# Patient Record
Sex: Male | Born: 1990
Health system: Southern US, Community
[De-identification: ages and names within clinical notes are randomized; demographics above are authoritative.]

## PROBLEM LIST (undated history)

## (undated) DIAGNOSIS — J309 Allergic rhinitis, unspecified: Secondary | ICD-10-CM

## (undated) HISTORY — PX: NO PAST SURGERIES: SHX2092

## (undated) HISTORY — DX: Allergic rhinitis, unspecified: J30.9

---

## 2013-02-23 ENCOUNTER — Encounter (HOSPITAL_BASED_OUTPATIENT_CLINIC_OR_DEPARTMENT_OTHER): Payer: Self-pay

## 2013-02-23 ENCOUNTER — Emergency Department (HOSPITAL_BASED_OUTPATIENT_CLINIC_OR_DEPARTMENT_OTHER)
Admission: EM | Admit: 2013-02-23 | Discharge: 2013-02-23 | Disposition: A | Discharge status: Home / Self Care | Payer: BC Managed Care – PPO | Attending: Emergency Medicine | Admitting: Emergency Medicine

## 2013-02-23 DIAGNOSIS — J45909 Unspecified asthma, uncomplicated: Secondary | ICD-10-CM | POA: Insufficient documentation

## 2013-02-23 DIAGNOSIS — Z79899 Other long term (current) drug therapy: Secondary | ICD-10-CM | POA: Insufficient documentation

## 2013-02-23 DIAGNOSIS — L539 Erythematous condition, unspecified: Secondary | ICD-10-CM

## 2013-02-23 DIAGNOSIS — Y939 Activity, unspecified: Secondary | ICD-10-CM | POA: Insufficient documentation

## 2013-02-23 DIAGNOSIS — F17200 Nicotine dependence, unspecified, uncomplicated: Secondary | ICD-10-CM | POA: Insufficient documentation

## 2013-02-23 DIAGNOSIS — L089 Local infection of the skin and subcutaneous tissue, unspecified: Secondary | ICD-10-CM | POA: Insufficient documentation

## 2013-02-23 DIAGNOSIS — Y929 Unspecified place or not applicable: Secondary | ICD-10-CM | POA: Insufficient documentation

## 2013-02-23 DIAGNOSIS — R0602 Shortness of breath: Secondary | ICD-10-CM | POA: Insufficient documentation

## 2013-02-23 DIAGNOSIS — R21 Rash and other nonspecific skin eruption: Secondary | ICD-10-CM | POA: Insufficient documentation

## 2013-02-23 NOTE — ED Notes (Signed)
Pt reports he was stung by a bee or bitten by a spider on chest wall.

## 2013-02-23 NOTE — ED Provider Notes (Signed)
History     CSN: [id number]  Arrival date & time 02/23/13  1547   First MD Initiated Contact with Patient 02/23/13 1601      Chief Complaint  Patient presents with  . Shortness of Breath  . Insect Bite    (Consider location/radiation/quality/duration/timing/severity/associated sxs/prior treatment) Patient is a 22 y.o. male presenting with rash. The history is provided by the patient. No language interpreter was used.  Rash Location:  Torso Torso rash location:  L chest Quality: redness   Severity:  Mild Onset quality:  Sudden Duration:  1 hour Progression:  Unchanged Chronicity:  New Context: insect bite/sting   Relieved by:  None tried Associated symptoms: no throat swelling, no tongue swelling and not wheezing     Past Medical History  Diagnosis Date  . Asthma     History reviewed. No pertinent past surgical history.  No family history on file.  History  Substance Use Topics  . Smoking status: Current Every Day Smoker -- 0.50 packs/day    Types: Cigarettes  . Smokeless tobacco: Not on file  . Alcohol Use: No      Review of Systems  Respiratory: Negative for wheezing.   Skin: Positive for rash.  All other systems reviewed and are negative.    Allergies  Review of patient's allergies indicates no known allergies.  Home Medications   Current Outpatient Rx  Name  Route  Sig  Dispense  Refill  . HydrOXYzine Pamoate (VISTARIL PO)   Oral   Take by mouth.           BP 127/79  Pulse 83  Temp(Src) 98.9 F (37.2 C) (Oral)  Resp 18  Ht 5\' 7"  (1.702 m)  Wt 185 lb (83.915 kg)  BMI 28.97 kg/m2  SpO2 98%  Physical Exam  Nursing note and vitals reviewed. Constitutional: He is oriented to person, place, and time. He appears well-developed and well-nourished. No distress.  HENT:  Head: Normocephalic and atraumatic.  Mouth/Throat: Oropharynx is clear and moist.  Eyes: Conjunctivae are normal. Pupils are equal, round, and reactive to light.  Neck:  Normal range of motion. Neck supple.  Cardiovascular: Normal rate and regular rhythm.   Pulmonary/Chest: Effort normal and breath sounds normal. No respiratory distress. He has no wheezes.  Abdominal: Soft. Bowel sounds are normal.  Musculoskeletal: Normal range of motion. He exhibits no edema and no tenderness.  Lymphadenopathy:    He has no cervical adenopathy.  Neurological: He is alert and oriented to person, place, and time.  Skin: Skin is warm and dry. There is erythema.     Psychiatric: He has a normal mood and affect. His behavior is normal. Judgment and thought content normal.    ED Course  Procedures (including critical care time)  Labs Reviewed - No data to display No results found.   No diagnosis found.  Insect bite to chest wall.  Patient did not see what ?bit or stung him on his chest. Feeling of shortness of breath likely due to anxiety.  Patient currently in no distress, clear lung fields bilaterally, localized erythema to anterior chest wall about the size of a quarter, not pruritic or painful.  No urticaria or angioedema.  Symptomatic treatment and return precautions provided.  MDM          Jimmye Norman, NP 02/23/13 [phone]

## 2013-02-23 NOTE — ED Notes (Addendum)
Pt states he was either bitten by a spider or stung by a bee on his chest around 1500.  Initially he felt [facility], but states his breathing feels better. C/o pain, denies itching.  Pt also states he feels a little dizzy. No meds PTA.

## 2013-02-26 NOTE — ED Provider Notes (Addendum)
Patient from review of records from 2014 to current has no history of asthma but his past medical history as documented asthma he has not been seen or any evaluated from 2014 on for any wheezing or any asthma related problems it appears clinically the patient does not truly have a history of asthma.  Medical screening examination/treatment/procedure(s) were performed by non-physician practitioner and as supervising physician I was immediately available for consultation/collaboration.   Shelda Jakes, MD 02/26/13 1433  Vanetta Mulders, MD 11/10/17 1326

## 2014-05-03 ENCOUNTER — Ambulatory Visit: Payer: Self-pay

## 2014-05-28 ENCOUNTER — Ambulatory Visit: Payer: Self-pay | Admitting: Podiatry

## 2014-06-12 ENCOUNTER — Ambulatory Visit (INDEPENDENT_AMBULATORY_CARE_PROVIDER_SITE_OTHER): Payer: BC Managed Care – PPO

## 2014-06-12 ENCOUNTER — Encounter: Payer: Self-pay | Admitting: Podiatry

## 2014-06-12 ENCOUNTER — Ambulatory Visit (INDEPENDENT_AMBULATORY_CARE_PROVIDER_SITE_OTHER): Payer: BC Managed Care – PPO | Admitting: Podiatry

## 2014-06-12 VITALS — BP 124/82 | HR 84 | Resp 16 | Ht 66.0 in | Wt 150.0 lb

## 2014-06-12 DIAGNOSIS — Q6650 Congenital pes planus, unspecified foot: Secondary | ICD-10-CM

## 2014-06-12 DIAGNOSIS — L608 Other nail disorders: Secondary | ICD-10-CM

## 2014-06-12 NOTE — Progress Notes (Signed)
   Subjective:    Patient ID: Terry Pham, male    DOB: [birth year], 23 y.o.   MRN: [id number]  HPI Comments: 23 year old male presents the office today with complaints of bilateral foot pain. He states the pain has been present for "years" with the left more than the right. If the pain is worse with weightbearing and ambulation and after physical activity. He does not wear any inserts for his shoes our he does wear a small lift in his shoes to add height. He also has secondary complaints of discoloration and difficulty trimming nails to his right foot. No other complaints at this time to  Foot Pain      Review of Systems  All other systems reviewed and are negative.      Objective:   Physical Exam AAO x3, NAD DP/PT pulses palpable b/l. CRT < 3sec.  Protective sensation intact with the Semmes Weinstein monofilament, vibratory sensation intact. Significant decrease the medial arch height bilaterally upon weightbearing with a left more than the right. Tenderness to palpation over the navicular tuberosity. Able to perform double and single heel rise. Mild equinus. MMT 5/5.  Nails thick, brittle, yellow-brown discoloration right foot worse than left.     Assessment & Plan:  23 year old male pes planus, nail dystrophy -X-rays were obtained. The x-ray report for full details -Discussed both conservative as well as surgical intervention with the patient in detail for pes planus including alternatives, risks, complications. At this time we'll proceed with conservative treatment  [facility] with orthotic therapy. Patient is unable it casted today due to time however he will call to get scheduled for scanning. -Nails on the right foot sharply debrided without difficulty to be sent for biopsy to evaluate for onychomycosis. -Followup for her orthotics scanning and will call patient with results of the nail biopsy.

## 2014-06-14 ENCOUNTER — Other Ambulatory Visit: Payer: BC Managed Care – PPO

## 2014-06-18 ENCOUNTER — Ambulatory Visit: Payer: BC Managed Care – PPO

## 2014-06-18 DIAGNOSIS — Q6650 Congenital pes planus, unspecified foot: Secondary | ICD-10-CM

## 2014-06-18 NOTE — Progress Notes (Signed)
Pt is here for orthotic scan

## 2014-06-24 ENCOUNTER — Encounter: Payer: Self-pay | Admitting: Podiatry

## 2014-07-09 ENCOUNTER — Other Ambulatory Visit: Payer: BC Managed Care – PPO

## 2014-07-12 ENCOUNTER — Ambulatory Visit: Payer: BC Managed Care – PPO | Admitting: Podiatry

## 2014-09-06 ENCOUNTER — Other Ambulatory Visit: Payer: BC Managed Care – PPO

## 2014-09-20 ENCOUNTER — Encounter (HOSPITAL_BASED_OUTPATIENT_CLINIC_OR_DEPARTMENT_OTHER): Payer: Self-pay

## 2014-09-20 ENCOUNTER — Emergency Department (HOSPITAL_BASED_OUTPATIENT_CLINIC_OR_DEPARTMENT_OTHER)
Admission: EM | Admit: 2014-09-20 | Discharge: 2014-09-20 | Disposition: A | Discharge status: Home / Self Care | Payer: BC Managed Care – PPO | Attending: Emergency Medicine | Admitting: Emergency Medicine

## 2014-09-20 DIAGNOSIS — Y9389 Activity, other specified: Secondary | ICD-10-CM | POA: Insufficient documentation

## 2014-09-20 DIAGNOSIS — S60561A Insect bite (nonvenomous) of right hand, initial encounter: Secondary | ICD-10-CM | POA: Insufficient documentation

## 2014-09-20 DIAGNOSIS — Y998 Other external cause status: Secondary | ICD-10-CM | POA: Diagnosis not present

## 2014-09-20 DIAGNOSIS — Z79899 Other long term (current) drug therapy: Secondary | ICD-10-CM | POA: Diagnosis not present

## 2014-09-20 DIAGNOSIS — W57XXXA Bitten or stung by nonvenomous insect and other nonvenomous arthropods, initial encounter: Secondary | ICD-10-CM | POA: Diagnosis not present

## 2014-09-20 DIAGNOSIS — Y9289 Other specified places as the place of occurrence of the external cause: Secondary | ICD-10-CM | POA: Diagnosis not present

## 2014-09-20 DIAGNOSIS — T63481A Toxic effect of venom of other arthropod, accidental (unintentional), initial encounter: Secondary | ICD-10-CM

## 2014-09-20 DIAGNOSIS — J45909 Unspecified asthma, uncomplicated: Secondary | ICD-10-CM | POA: Diagnosis not present

## 2014-09-20 DIAGNOSIS — Z720 Tobacco use: Secondary | ICD-10-CM | POA: Insufficient documentation

## 2014-09-20 MED ORDER — EPINEPHRINE 0.3 MG/0.3ML IJ SOAJ: 0.3000 mg | Freq: Once | INTRAMUSCULAR | Status: DC | PRN

## 2014-09-20 MED ORDER — DIPHENHYDRAMINE HCL 12.5 MG/5ML PO ELIX
25.0000 mg | ORAL_SOLUTION | Freq: Once | ORAL | Status: AC
  Administered 2014-09-20: 25 mg via ORAL
  Filled 2014-09-20: qty 10

## 2014-09-20 MED ORDER — FAMOTIDINE 20 MG PO TABS
20.0000 mg | ORAL_TABLET | Freq: Once | ORAL | Status: AC
  Administered 2014-09-20: 20 mg via ORAL
  Filled 2014-09-20: qty 1

## 2014-09-20 MED ORDER — PREDNISOLONE SODIUM PHOSPHATE 15 MG/5ML PO SOLN
40.0000 mg | Freq: Once | ORAL | Status: AC
  Administered 2014-09-20: 40 mg via ORAL
  Filled 2014-09-20: qty 15

## 2014-09-20 MED ORDER — FAMOTIDINE 20 MG PO TABS
20.0000 mg | ORAL_TABLET | Freq: Every day | ORAL | Status: DC
Start: 2014-09-20 — End: 2018-01-31

## 2014-09-20 MED ORDER — PREDNISOLONE SODIUM PHOSPHATE 15 MG/5ML PO SOLN: 40.0000 mg | Freq: Every day | ORAL | Status: AC

## 2014-09-20 MED ORDER — PREDNISOLONE 15 MG/5ML PO SOLN
ORAL | Status: AC
  Administered 2014-09-20: 40 mg via ORAL
  Filled 2014-09-20: qty 3

## 2014-09-20 NOTE — ED Provider Notes (Addendum)
CSN: [id number]     Arrival date & time 09/20/14  1801 History   First MD Initiated Contact with Patient 09/20/14 2214     Chief Complaint  Patient presents with  . Insect Bite   (Consider location/radiation/quality/duration/timing/severity/associated sxs/prior Treatment) HPI  Terry Pham is a 23 yo male presenting with local allergic reaction to right hand after flying insect sting yesterday.  He was outside and felt something flying behind his neck, he swatted at it and felt a sharp sting to his right hand.  His hand began to swell and become reddened.  He denies any shortness of breath, nausea,vomiting, abd pain, voice change or oral airway swelling.  Past Medical History  Diagnosis Date      Patient had history of asthma listed- denies history of asthma This is a late entry and is edited per patient request of amendment History reviewed. No pertinent past surgical history. No family history on file. History  Substance Use Topics  . Smoking status: Current Every Day Smoker  . Smokeless tobacco: Not on file  . Alcohol Use: No    Review of Systems  Constitutional: Negative for fever and chills.  HENT: Negative for sore throat and voice change.   Respiratory: Negative for chest tightness, shortness of breath and wheezing.   Cardiovascular: Negative for chest pain.  Gastrointestinal: Negative for nausea, vomiting, abdominal pain and diarrhea.  Musculoskeletal: Negative for myalgias.  Skin: Positive for color change and rash.  Neurological: Negative for syncope, weakness, numbness and headaches.      Allergies  Review of patient's allergies indicates no known allergies.  Home Medications   Prior to Admission medications   Medication Sig Start Date End Date Taking? Authorizing Provider  HYDROXYZINE HCL PO Take by mouth.   Yes Historical Provider, MD   BP 135/90 mmHg  Pulse 109  Temp(Src) 98.3 F (36.8 C) (Oral)  Resp 20  Ht 5\' 3"  (1.6 m)  Wt 180 lb (81.647 kg)   BMI 31.89 kg/m2  SpO2 100% Physical Exam  Constitutional: He appears well-developed and well-nourished. No distress.  HENT:  Head: Normocephalic and atraumatic.  Mouth/Throat: Oropharynx is clear and moist. No oropharyngeal exudate.  Eyes: Conjunctivae are normal.  Neck: Normal range of motion. Neck supple.  Cardiovascular: Normal rate, regular rhythm and intact distal pulses.   Pulmonary/Chest: Effort normal and breath sounds normal. No respiratory distress. He has no wheezes. He has no rales. He exhibits no tenderness.  Abdominal: Soft. There is no tenderness.  Musculoskeletal: He exhibits no tenderness.  Neurological: He is alert.  Skin: Skin is warm and dry. Rash noted. Rash is urticarial. He is not diaphoretic. There is erythema.     Good pulse, motor and sensation to right hand.  Psychiatric: He has a normal mood and affect.  Nursing note and vitals reviewed.   ED Course  Procedures (including critical care time) Labs Review Labs Reviewed - No data to display  Imaging Review No results found.   EKG Interpretation None      MDM   Final diagnoses:  Allergic reaction to insect sting, accidental or unintentional, initial encounter   23 yo male with local allergic reaction, no signs of anaphylaxis. He has trouble swallowing pills prior to the allergic reaction, so liquid versions of benadryl, prednisone and pepcid were given in the ED. He was re-evaluated prior to dc, he remains hemodynamically stable, in no respiratory distress, and denies the feeling of throat closing. Pt has been advised to take OTC  benadryl & return to the ED if they have a mod-severe allergic rxn (s/s including throat closing, difficulty breathing, swelling of lips face or tongue). Pt is to follow up with their PCP. Pt is agreeable with plan & verbalizes understanding.   Filed Vitals:   09/20/14 1809 09/20/14 2242  BP: 135/90 110/67  Pulse: 109 75  Temp: 98.3 F (36.8 C)   TempSrc: Oral   Resp:  20 16  Height: 5\' 3"  (1.6 m)   Weight: 180 lb (81.647 kg)   SpO2: 100% 99%   Meds given in ED:  Medications  diphenhydrAMINE (BENADRYL) 12.5 MG/5ML elixir 25 mg (25 mg Oral Given 09/20/14 2243)  prednisoLONE (ORAPRED) 15 MG/5ML solution 40 mg (40 mg Oral Given 09/20/14 2245)  famotidine (PEPCID) tablet 20 mg (20 mg Oral Given 09/20/14 2245)    Discharge Medication List as of 09/20/2014 10:49 PM    START taking these medications   Details  EPINEPHrine (EPIPEN 2-PAK) 0.3 mg/0.3 mL IJ SOAJ injection Inject 0.3 mLs (0.3 mg total) into the muscle once as needed (for severe allergic reaction). CAll 911 immediately if you have to use this medicine, Starting 09/20/2014, Until Discontinued, Print    famotidine (PEPCID) 20 MG tablet Take 1 tablet (20 mg total) by mouth daily., Starting 09/20/2014, Until Discontinued, Print    prednisoLONE (ORAPRED) 15 MG/5ML solution Take 13.3 mLs (40 mg total) by mouth daily before breakfast., Starting 09/20/2014, Until Wed 09/25/14, Print           Harle BattiestElizabeth Tysinger, NP 09/22/14 1546  Hilario Quarryanielle S Mirage Pfefferkorn, MD 09/28/14 [id number]  Margarita Grizzleay, Enzio Buchler, MD 12/12/17 [id number]  Margarita Grizzleay, Deontaye Civello, MD 12/12/17 [phone]

## 2014-09-20 NOTE — ED Notes (Signed)
Assessed from triage room-NAD

## 2014-09-20 NOTE — ED Notes (Signed)
Bee sting to middle finger rt hand yesterday,  Swelling to rt hand  Ice applied

## 2014-09-20 NOTE — ED Notes (Addendum)
?  bee sting to palm of right hand yesterday-swelling/redness to back of hand today-no resp distress-states he has been stung by bees before without systemic reaction

## 2014-09-20 NOTE — Discharge Instructions (Signed)
Please follow the directions provided.  Be sure to follow-up with your primary care provider to ensure you are getting better.  You may take over the counter benadryl 25 mg every 6 hours.  The orapred and pepcid take as directed.  The epipen is only to use for emergency allergic reactions including sudden shortness of breath, facial or airway swelling, dizziness/lightheadedness, nausea, vomiting or abd pain.  If you use your epipen you must still come to the emergency department for further treatment.  Don't hesitate to return for any new, worsening or concerning symptoms.    SEEK IMMEDIATE MEDICAL CARE IF:  You have symptoms of an allergic reaction which are:  Wheezing.  Difficulty breathing.  Chest pain.  Lightheadedness or fainting.  Itchy, raised, red patches on the skin.  Nausea, vomiting, cramping or diarrhea.   Emergency Department Resource Guide 1) Find a Doctor and Pay Out of Pocket Although you won't have to find out who is covered by your insurance plan, it is a good idea to ask around and get recommendations. You will then need to call the office and see if the doctor you have chosen will accept you as a new patient and what types of options they offer for patients who are self-pay. Some doctors offer discounts or will set up payment plans for their patients who do not have insurance, but you will need to ask so you aren't surprised when you get to your appointment.  2) Contact Your Local Health Department Not all health departments have doctors that can see patients for sick visits, but many do, so it is worth a call to see if yours does. If you don't know where your local health department is, you can check in your phone book. The CDC also has a tool to help you locate your state's health department, and many state websites also have listings of all of their local health departments.  3) Find a Walk-in Clinic If your illness is not likely to be very severe or complicated, you may  want to try a walk in clinic. These are popping up all over the country in pharmacies, drugstores, and shopping centers. They're usually staffed by nurse practitioners or physician assistants that have been trained to treat common illnesses and complaints. They're usually fairly quick and inexpensive. However, if you have serious medical issues or chronic medical problems, these are probably not your best option.  No Primary Care Doctor: - Call Health Connect at  [phone] - they can help you locate a primary care doctor that  accepts your insurance, provides certain services, etc. - Physician Referral Service- [phone]  Chronic Pain Problems: Organization         Address  Phone   Notes  [facility]  [phone] Patients need to be referred by their primary care doctor.   Medication Assistance: Organization         Address  Phone   Notes  [facility]ication [facility] [address] [place], [place] [address] [phone] --Must be a resident of [place] -- Must have NO insurance coverage whatsoever (no Medicaid/ Medicare, etc.) -- The pt. MUST have a primary care doctor that directs their care regularly and follows them in the community   MedAssist  [phone]) [phone]   [employer]  [phone]    Agencies that provide inexpensive medical care: Organization         Address  Phone   Notes  [facility]  [phone]   [facility]    [phone]   [address] [facility] [place], [place] [address] [phone]   [facility] [address], [place] [phone]   Planned Parenthood    [phone]   [facility]    [phone]   [place] and [place] [address], [place] Phone:  [phone], Fax:  [phone] Hours of Operation:  9 am - 6 pm, M-F.  Also accepts Medicaid/Medicare and self-pay.   [facility] [place] for [place] [address], [address], [place] Phone: [phone], Fax: [phone]. Hours of Operation:  8:30 am - 5:30 pm, M-F.  Also accepts Medicaid and self-pay.  [facility] [place] [address] [place] Phone: [phone]   [place], [place], [place] [phone], Ext. [phone] Mondays & Thursdays: 7-9 AM.  First 15 patients are seen on a first come, first serve basis.    [place] Providers:  Organization         Address  Phone   Notes  [facility] [address], [place] [phone] Also accepts self-pay patients.  [facility] [address], [place]  [phone]   [place], [address], [place] [phone]   [facility] [address] [place] [phone]   Lucianne Lei [address], [place]   [phone] Only accepts [place] Access [place] patients after they have their name applied to their card.   Self-Pay (no insurance) in [facility]:  Organization         Address  Phone   Notes  Sickle Cell Patients, [facility] [place] [phone]   [facility] [place] [phone]   [facility] [place]  [place], [place], [place] [phone]   [facility]/Dr. Osei-Bonsu  [address] [place] or [address], [place] [phone] Phone number for both [place] and [place] locations is the same.  [facility]gent [facility] and [facility] [address], [place] [phone]   [facility] [address] [place] or [address] [phone]   [facility] [address], [place] [phone], phone; [phone], fax Sees patients 1st and 3rd Saturday of every month.  Must not qualify for public or private insurance (i.e. Medicaid, Medicare, [place] Health Choice, Veterans' Benefits)  Household income should be no more than 200% of the poverty level The clinic cannot treat you if you are pregnant or think you are pregnant  Sexually transmitted diseases are not treated at the clinic.    Dental Care: Organization         Address  Phone  Notes  [facility] Department of [facility] [phone] Accepts children up to age 80 who are enrolled in [place] or [place]; pregnant women with a Medicaid card; and children who have applied for Medicaid or  Health Choice, but were declined, whose parents can pay a reduced fee at time of service.  [facility] Department of  [facility]  [address], [place] [phone] Accepts children up to age 81 who are enrolled in Medicaid or [place]; pregnant women with a Medicaid card; and children who have applied for Medicaid or [place] Health Choice, but were declined, whose parents can pay a reduced fee at time of service.  [facility] Access PRO[facility]  [place] [phone] Patients are seen by appointment only. Walk-ins are not accepted. [place] will see patients 26 years of age and older. Monday - Tuesday (8am-5pm) Most Wednesdays (8:30-5pm) $30 per visit, cash only  [facility] Adult Dental Access PROGRAM  [address], [facility] [phone] Patients are seen by appointment only. Walk-ins are not accepted. [place] will see patients 48 years of age and older. One Wednesday Evening (Monthly: Volunteer Based).  $30 per visit, cash only  [place]  [phone] for adults; Children under age 78, call Graduate Pediatric Dentistry at [phone]. Children aged 19-14, please call [phone] to request a pediatric application.  Dental services are provided in all areas of dental care including fillings, crowns and bridges, complete and partial dentures, implants, gum treatment, root canals, and extractions. Preventive care is also provided. Treatment is provided to both adults and children. Patients are selected via a lottery and there is often a waiting list.   [facility] [address], [place]  [phone] [email or web address]   [facility] [address] [place], [place] [phone], Ext. [phone] Second and Fourth Thursday of each month, opens at 6:30 AM; Clinic ends at 9 AM.  Patients are seen on a first-come first-served basis, and a limited number are seen during each clinic.   [facility]  [address] Hillard Danker [place], [place] [phone]   Eligibility Requirements You must have lived in [place], [place], or [place] counties for at least the last three months.   You cannot be eligible for state or federal sponsored [employer], including [employer], [place], or [employer].   You generally cannot be eligible for healthcare insurance through your employer.    How to apply: Eligibility screenings are held every Tuesday and Wednesday afternoon from 1:00 pm until 4:00 pm. You do not need an appointment for the interview!  [facility] [address] [place], [place]   [place]  [place] Department  [place]  [phone]    Behavioral Health Resources in the Community: Intensive Outpatient Programs Organization         Address  Phone  Notes  [place] [facility]. [address] [place], [place] [phone]   [facility] [address], [place], [place]   ADS: [facility] [address] [place], [place]    [place] [address]  [place], [place] or [phone]   Substance Abuse Resources Organization         Address  Phone  Notes  [facility] Drug Services  [phone]   [place]  [phone]   [facility] [place]   Chinita Pester  [phone]   Residential & Outpatient Substance Abuse Program  [phone]   Psychological Services Organization         Address  Phone  Notes  [place]  [phone]   [facility]  336- [phone]   [facility] [address], [place] [phone] or [phone]    Mobile Crisis Teams Organization         Address  Phone  Notes  [facility], Mobile Crisis Care Unit  [phone]   [facility]  [address] [place], [place] [phone]   Doristine LocksSharon DeEsch [address] [place] [phone]    Self-Help/Support Groups Organization         Address  Phone             Notes  [facility]ental Health Assoc[facility] of [place] - variety of support groups  336- [phone] Call for more information  Narcotics Anonymous (NA), Caring [facility] [address], [place]  2 meetings at this location   Statisticianesidential Treatment Programs Organization         Address  Phone  Notes  [facility] [address],    [place]  [phone]   [facility]  [address], [place] [phone], [place], [place] [phone]   [facility] [address], [place] [phone] Admissions: 8am-3pm M-F  [facility] [address]    [place], [place] [phone]   [facility] [address], [place], [place] [phone]   [facility] [address]  [place], [place] [phone]   [facility] - Intensive Outpatient [address], [place], [place] [phone]   [facility] ([facility] [address]  [place], [place] [phone] or [phone]   [facility] (RTS) [address] [place], [place] [phone] Accepts Medicaid  [facility] [address]  [place] [phone] Substance Abuse/Addiction Treatment   [facility] Behavioral Health Resources Organization         Address  Phone  Notes  [facility]  [phone](888) [phone]   Angie FavaJulie Brannon, PhD [address], [address] [place], [place]   [phone]) [phone] or [phone]) [phone]   [facility]    [address] [place], [place] [phone]) [phone]   [facility] [address], [place], [place] [phone]) [phone] Insurance/Medicaid/sponsorship through [facility]  [facility] [address]                                    [place], [place] [phone]) [phone] Therapy/tele-psych/case  [facility] [address]   [place], [place] [phone]) [phone]    Dr. Lolly MustacheArfeen  [phone]) [phone]   [facility] [place] [place]  [employer] [facility] 1) [address], [place] 2) [address], [place] 3)  [address], [place] [phone](336) [phone]  [phone]) [phone]   [facility] Child Abuse Hotline [phone](336) 4077828313 or [phone](336) [phone] (After Hours)

## 2014-10-04 ENCOUNTER — Other Ambulatory Visit: Payer: BC Managed Care – PPO

## 2014-10-11 ENCOUNTER — Other Ambulatory Visit: Payer: BC Managed Care – PPO

## 2014-12-31 ENCOUNTER — Other Ambulatory Visit: Payer: BLUE CROSS/BLUE SHIELD

## 2015-03-31 ENCOUNTER — Ambulatory Visit: Payer: BLUE CROSS/BLUE SHIELD | Admitting: *Deleted

## 2015-03-31 DIAGNOSIS — Q6650 Congenital pes planus, unspecified foot: Secondary | ICD-10-CM

## 2015-03-31 NOTE — Progress Notes (Signed)
Patient ID: Terry Pham, male   DOB: 11-Jan-1991, 24 y.o.   MRN: [id number] PICKING UP INSERTS

## 2015-03-31 NOTE — Patient Instructions (Signed)

## 2015-08-08 ENCOUNTER — Encounter (HOSPITAL_COMMUNITY): Payer: Self-pay | Admitting: *Deleted

## 2015-08-08 ENCOUNTER — Emergency Department (HOSPITAL_COMMUNITY)
Admission: EM | Admit: 2015-08-08 | Discharge: 2015-08-08 | Disposition: A | Discharge status: Home / Self Care | Payer: BLUE CROSS/BLUE SHIELD | Attending: Emergency Medicine | Admitting: Emergency Medicine

## 2015-08-08 DIAGNOSIS — J029 Acute pharyngitis, unspecified: Secondary | ICD-10-CM | POA: Diagnosis not present

## 2015-08-08 DIAGNOSIS — Z79899 Other long term (current) drug therapy: Secondary | ICD-10-CM | POA: Diagnosis not present

## 2015-08-08 DIAGNOSIS — J45909 Unspecified asthma, uncomplicated: Secondary | ICD-10-CM | POA: Diagnosis not present

## 2015-08-08 DIAGNOSIS — Z720 Tobacco use: Secondary | ICD-10-CM | POA: Diagnosis not present

## 2015-08-08 DIAGNOSIS — R070 Pain in throat: Secondary | ICD-10-CM | POA: Diagnosis present

## 2015-08-08 NOTE — ED Notes (Signed)
Per EMS, pt complains of a sore lump in his throat, around his epiglottis. Pt states he noticed the lump on Monday, after which he took benadryl, which he states helped. Pt states he noticed the lump again this morning, which he states "has been freaking me out". Pt took 3 teaspoons of children's benadryl before arrival to hospital. Pt denies cough/congestion.sneezing.

## 2015-08-08 NOTE — Discharge Instructions (Signed)
- Use OTC pain relievers (advil, tylenol) and chloraseptic spray for symptom relief - Return to ED with difficulty breathing or inability to drink or swallow   Pharyngitis Pharyngitis is redness, pain, and swelling (inflammation) of your pharynx.  CAUSES  Pharyngitis is usually caused by infection. Most of the time, these infections are from viruses (viral) and are part of a cold. However, sometimes pharyngitis is caused by bacteria (bacterial). Pharyngitis can also be caused by allergies. Viral pharyngitis may be spread from person to person by coughing, sneezing, and personal items or utensils (cups, forks, spoons, toothbrushes). Bacterial pharyngitis may be spread from person to person by more intimate contact, such as kissing.  SIGNS AND SYMPTOMS  Symptoms of pharyngitis include:   Sore throat.   Tiredness (fatigue).   Low-grade fever.   Headache.  Joint pain and muscle aches.  Skin rashes.  Swollen lymph nodes.  Plaque-like film on throat or tonsils (often seen with bacterial pharyngitis). DIAGNOSIS  Your health care provider will ask you questions about your illness and your symptoms. Your medical history, along with a physical exam, is often all that is needed to diagnose pharyngitis. Sometimes, a rapid strep test is done. Other lab tests may also be done, depending on the suspected cause.  TREATMENT  Viral pharyngitis will usually get better in 3-4 days without the use of medicine. Bacterial pharyngitis is treated with medicines that kill germs (antibiotics).  HOME CARE INSTRUCTIONS   Drink enough water and fluids to keep your urine clear or pale yellow.   Only take over-the-counter or prescription medicines as directed by your health care provider:   If you are prescribed antibiotics, make sure you finish them even if you start to feel better.   Do not take aspirin.   Get lots of rest.   Gargle with 8 oz of salt water ( tsp of salt per 1 qt of water) as  often as every 1-2 hours to soothe your throat.   Throat lozenges (if you are not at risk for choking) or sprays may be used to soothe your throat. SEEK MEDICAL CARE IF:   You have large, tender lumps in your neck.  You have a rash.  You cough up green, yellow-brown, or bloody spit. SEEK IMMEDIATE MEDICAL CARE IF:   Your neck becomes stiff.  You drool or are unable to swallow liquids.  You vomit or are unable to keep medicines or liquids down.  You have severe pain that does not go away with the use of recommended medicines.  You have trouble breathing (not caused by a stuffy nose). MAKE SURE YOU:   Understand these instructions.  Will watch your condition.  Will get help right away if you are not doing well or get worse.   This information is not intended to replace advice given to you by your health care provider. Make sure you discuss any questions you have with your health care provider.   Document Released: 10/11/2005 Document Revised: 08/01/2013 Document Reviewed: 06/18/2013 Elsevier Interactive Patient Education 2016 Elsevier Inc.  Sore Throat A sore throat is pain, burning, irritation, or scratchiness of the throat. There is often pain or tenderness when swallowing or talking. A sore throat may be accompanied by other symptoms, such as coughing, sneezing, fever, and swollen neck glands. A sore throat is often the first sign of another sickness, such as a cold, flu, strep throat, or mononucleosis (commonly known as mono). Most sore throats go away without medical treatment. CAUSES  The most common causes of a sore throat include:  A viral infection, such as a cold, flu, or mono.  A bacterial infection, such as strep throat, tonsillitis, or whooping cough.  Seasonal allergies.  Dryness in the air.  Irritants, such as smoke or pollution.  Gastroesophageal reflux disease (GERD). HOME CARE INSTRUCTIONS   Only take over-the-counter medicines as directed by your  caregiver.  Drink enough fluids to keep your urine clear or pale yellow.  Rest as needed.  Try using throat sprays, lozenges, or sucking on hard candy to ease any pain (if older than 4 years or as directed).  Sip warm liquids, such as broth, herbal tea, or warm water with honey to relieve pain temporarily. You may also eat or drink cold or frozen liquids such as frozen ice pops.  Gargle with salt water (mix 1 tsp salt with 8 oz of water).  Do not smoke and avoid secondhand smoke.  Put a cool-mist humidifier in your bedroom at night to moisten the air. You can also turn on a hot shower and sit in the bathroom with the door closed for 5-10 minutes. SEEK IMMEDIATE MEDICAL CARE IF:  You have difficulty breathing.  You are unable to swallow fluids, soft foods, or your saliva.  You have increased swelling in the throat.  Your sore throat does not get better in 7 days.  You have nausea and vomiting.  You have a fever or persistent symptoms for more than 2-3 days.  You have a fever and your symptoms suddenly get worse. MAKE SURE YOU:   Understand these instructions.  Will watch your condition.  Will get help right away if you are not doing well or get worse.   This information is not intended to replace advice given to you by your health care provider. Make sure you discuss any questions you have with your health care provider.   Document Released: 11/18/2004 Document Revised: 11/01/2014 Document Reviewed: 06/18/2012 Elsevier Interactive Patient Education [employer]

## 2015-08-08 NOTE — ED Notes (Signed)
Bed: WTR7 Expected date:  Expected time:  Means of arrival:  Comments: Ems-Lump in throat

## 2015-08-08 NOTE — ED Provider Notes (Addendum)
CSN: [id number]     Arrival date & time 08/08/15  1216 History  By signing my name below, I, Terry Pham, attest that this documentation has been prepared under the direction and in the presence of Stevi Barrett, PA-C. Electronically Signed: Tanda RockersMargaux Pham, ED Scribe. 08/08/2015. 12:52 PM.  Chief Complaint  Patient presents with  .    The history is provided by the patient. No language interpreter was used.     HPI Comments: Terry Pham is a 24 y.o. male brought in by ambulance, who presents to the Emergency Department complaining of gradual onset, intermittent, lump in his throat x 4-5 days ago. Pt states he can see that his uvula is bigger than normal and believes that this is causing the sensation of a lump in his throat. He took Benadryl when the symptoms first began with relief. He reports that the lump was present for an hour but went away after taking Benadryl. The sensation returned this morning and has been persistent since. Pt states it is hard to swallow due to it being painful. He has been able to eat and drink normally. Denies choking, shortness of breath, fever, chills, hot sweats, congestion, rhinorrhea, ear pain, eye itching, eye pain, nausea, vomiting, chest pain, diarrhea, or any other associated symptoms. No recent sick contact with similar symptoms. Pt is not currently in school.   Past Medical History:  Diagnosis Date   Prior chart reports asthma, however patient denies this ever being a diagnosis and believes it was entered in error.    History reviewed. No pertinent surgical history. No family history on file. Social History   Tobacco Use  . Smoking status: Current Every Day Smoker  . Smokeless tobacco: Never Used  Substance Use Topics  . Alcohol use: No  . Drug use: No    Review of Systems  Constitutional: Negative for fever, chills and diaphoresis.  HENT: Positive for sore throat and trouble swallowing. Negative for congestion and rhinorrhea.        +  Lump in throat  Eyes: Negative for pain and itching.  Respiratory: Negative for shortness of breath.   Cardiovascular: Negative for chest pain.  Gastrointestinal: Negative for nausea, vomiting and diarrhea.   Allergies  Patient has no known allergies.  Home Medications   Prior to Admission medications   Medication Sig Start Date End Date Taking? Authorizing Provider  EPINEPHrine (EPIPEN 2-PAK) 0.3 mg/0.3 mL IJ SOAJ injection Inject 0.3 mLs (0.3 mg total) into the muscle once as needed (for severe allergic reaction). CAll 911 immediately if you have to use this medicine 09/20/14   Harle Battiestysinger, Elizabeth, NP  famotidine (PEPCID) 20 MG tablet Take 1 tablet (20 mg total) by mouth daily. Patient not taking: Reported on 06/22/2016 09/20/14   Harle Battiestysinger, Elizabeth, NP  hydrOXYzine (ATARAX) 10 MG/5ML syrup Take 10 mg by mouth 2 (two) times daily as needed (allergies).    [provider]  HydrOXYzine Pamoate (VISTARIL PO) Take by mouth.    [provider]   Triage VItals: BP 109/75 mmHg  Pulse 71  Temp(Src) 98 F (36.7 C) (Oral)  Resp 18  SpO2 98%   Physical Exam  Constitutional: He appears well-developed and well-nourished. No distress.  HENT:  Head: Normocephalic and atraumatic.  Right Ear: External ear normal.  Left Ear: External ear normal.  Mouth/Throat: Uvula is midline and mucous membranes are normal. No trismus in the jaw. No uvula swelling. Posterior oropharyngeal erythema present. No oropharyngeal exudate or posterior oropharyngeal edema.  Some oropharyngeal erythema. No tonsillar or uvula edema. No oropharyngeal exudates. Pt handling secretions well  Eyes: Conjunctivae are normal. Right eye exhibits no discharge. Left eye exhibits no discharge. No scleral icterus.  Neck: Normal range of motion. Neck supple.  No anterior or posterior cervical adenopathy. No swelling of neck. Painless ROM.   Cardiovascular: Normal rate, regular rhythm and normal heart sounds.    Pulmonary/Chest: Effort normal and breath sounds normal. No stridor. No respiratory distress. He has no wheezes. He has no rales.  Pt breathing unlabored.   Musculoskeletal: Normal range of motion.  Moves all extremities spontaneously  Lymphadenopathy:    He has no cervical adenopathy.  Neurological: He is alert. Coordination normal.  Skin: Skin is warm and dry.  Psychiatric: He has a normal mood and affect. His behavior is normal.    ED Course  Procedures (including critical care time)  DIAGNOSTIC STUDIES: Oxygen Saturation is 98% on RA, normal by my interpretation.    COORDINATION OF CARE: 12:50 PM-Discussed treatment plan which includes OTC pain medication and chloraseptic spray with pt at bedside and pt agreed to plan.   Labs Review Labs Reviewed - No data to display  Imaging Review No results found.   EKG Interpretation None       MDM   Final diagnoses:  Viral pharyngitis   Pt afebrile without tonsillar exudate or cervical adenopathy. Centor score 1 - no further testing or antibiotics indicated. Presents with mild dysphagia; diagnosis of viral pharyngitis. DC w symptomatic tx for pain to include OTC pain relievers and chloraseptic spray. Pt may continue benadryl if it provides him relief.  Pt does not appear dehydrated, but did discuss importance of water rehydration. Presentation non concerning for PTA or infxn spread to soft tissue. No trismus or uvula deviation. Specific return precautions discussed. Pt able to drink water in ED without difficulty with intact air way. Recommended PCP follow up.  I personally performed the services described in this documentation, which was scribed in my presence. The recorded information has been reviewed and is accurate.      [name], PA-C 08/08/15 1311  [name], MD 08/09/15 1610  [name], MD 10/06/17 0004

## 2016-06-22 ENCOUNTER — Encounter (HOSPITAL_COMMUNITY): Payer: Self-pay | Admitting: Emergency Medicine

## 2016-06-22 ENCOUNTER — Emergency Department (HOSPITAL_COMMUNITY)
Admission: EM | Admit: 2016-06-22 | Discharge: 2016-06-22 | Disposition: A | Discharge status: Home / Self Care | Payer: BLUE CROSS/BLUE SHIELD | Attending: Emergency Medicine | Admitting: Emergency Medicine

## 2016-06-22 DIAGNOSIS — J45909 Unspecified asthma, uncomplicated: Secondary | ICD-10-CM | POA: Insufficient documentation

## 2016-06-22 DIAGNOSIS — K122 Cellulitis and abscess of mouth: Secondary | ICD-10-CM | POA: Diagnosis not present

## 2016-06-22 DIAGNOSIS — F17200 Nicotine dependence, unspecified, uncomplicated: Secondary | ICD-10-CM | POA: Insufficient documentation

## 2016-06-22 DIAGNOSIS — Z79899 Other long term (current) drug therapy: Secondary | ICD-10-CM | POA: Insufficient documentation

## 2016-06-22 DIAGNOSIS — J029 Acute pharyngitis, unspecified: Secondary | ICD-10-CM | POA: Diagnosis present

## 2016-06-22 LAB — RAPID STREP SCREEN (MED CTR MEBANE ONLY): Streptococcus, Group A Screen (Direct): NEGATIVE

## 2016-06-22 MED ORDER — PREDNISOLONE 15 MG/5ML PO SOLN: 10.0000 mg | Freq: Every day | ORAL | 0 refills | Status: AC

## 2016-06-22 MED ORDER — PREDNISOLONE SODIUM PHOSPHATE 15 MG/5ML PO SOLN
10.0000 mg | Freq: Once | ORAL | Status: AC
  Administered 2016-06-22: 10 mg via ORAL
  Filled 2016-06-22: qty 1

## 2016-06-22 NOTE — ED Triage Notes (Signed)
Patient reports swollen uvula starting this morning. States it is painful to swollen. Denies cough. Lung sounds clear upon auscultation.  Patient states he has had this happen before and was prescribed prednisone.

## 2016-06-22 NOTE — Discharge Instructions (Signed)
Your strep screen today was negative. Take steroids as prescribed. Follow up with your PCP as needed. Return to the ED if you experience difficulty breathing, inability to swallow, fevers or chills.

## 2016-06-22 NOTE — ED Provider Notes (Signed)
[facility]-EMERGENCY DEPT Provider Note   CSN: [id number] Arrival date & time: 06/22/16  4098     History   Chief Complaint Chief Complaint  Patient presents with  . Sore Throat    HPI [place] Pham is a 25 y.o. male the past medical history of asthma who presents to the ED today complaining of sore throat. Patient states he woke up this morning and his uvula was swollen and painful. Patient reports pain with swallowing. Patient reports history of similar symptoms 5 weeks ago and he was given prednisone by his PCP and his symptoms resolved. He denies any shortness of breath, cough, runny nose, otalgia. No sick contacts.  HPI  Past Medical History:  Diagnosis Date  . Asthma     There are no active problems to display for this patient.   History reviewed. No pertinent surgical history.     Home Medications    Prior to Admission medications   Medication Sig Start Date End Date Taking? Authorizing Provider  EPINEPHrine (EPIPEN 2-PAK) 0.3 mg/0.3 mL IJ SOAJ injection Inject 0.3 mLs (0.3 mg total) into the muscle once as needed (for severe allergic reaction). CAll 911 immediately if you have to use this medicine 09/20/14  Yes Harle Battiest, NP  hydrOXYzine (ATARAX) 10 MG/5ML syrup Take 10 mg by mouth 2 (two) times daily as needed (allergies).   Yes Historical Provider, MD  famotidine (PEPCID) 20 MG tablet Take 1 tablet (20 mg total) by mouth daily. Patient not taking: Reported on 06/22/2016 09/20/14   Harle Battiest, NP    Family History No family history on file.  Social History Social History  Substance Use Topics  . Smoking status: Current Every Day Smoker  . Smokeless tobacco: Never Used  . Alcohol use No     Allergies   Review of patient's allergies indicates no known allergies.   Review of Systems Review of Systems  All other systems reviewed and are negative.    Physical Exam Updated Vital Signs BP 123/69 (BP Location: Left Arm)   Pulse 84    Temp 97.8 F (36.6 C) (Oral)   Resp 18   Ht 5\' 7"  (1.702 m)   Wt 81.6 kg   SpO2 99%   BMI 28.19 kg/m   Physical Exam  Constitutional: He is oriented to person, place, and time. He appears well-developed and well-nourished. No distress.  HENT:  Head: Normocephalic and atraumatic.  Mouth/Throat: Uvula is midline, oropharynx is clear and moist and mucous membranes are normal. Uvula swelling ( erythematous) present. No oropharyngeal exudate, posterior oropharyngeal edema, posterior oropharyngeal erythema or tonsillar abscesses.  Eyes: Conjunctivae are normal. Right eye exhibits no discharge. Left eye exhibits no discharge. No scleral icterus.  Cardiovascular: Normal rate.   Pulmonary/Chest: Effort normal.  Neurological: He is alert and oriented to person, place, and time. Coordination normal.  Skin: Skin is warm and dry. No rash noted. He is not diaphoretic. No erythema. No pallor.  Psychiatric: He has a normal mood and affect. His behavior is normal.  Nursing note and vitals reviewed.    ED Treatments / Results  Labs (all labs ordered are listed, but only abnormal results are displayed) Labs Reviewed  RAPID STREP SCREEN (NOT AT [facility])  CULTURE, GROUP A STREP [facility])    EKG  EKG Interpretation None       Radiology No results found.  Procedures Procedures (including critical care time)  Medications Ordered in ED Medications  prednisoLONE (ORAPRED) 15 MG/5ML solution 10 mg (10 mg  Oral Given 06/22/16 1019)     Initial Impression / Assessment and Plan / ED Course  I have reviewed the triage vital signs and the nursing notes.  Pertinent labs & imaging results that were available during my care of the patient were reviewed by me and considered in my medical decision making (see chart for details).  Clinical Course    Otherwise healthy 25 y.o presents to the ED with uvulitis. Pt afebrile. Non-toxic. No breathing or swallowing difficulty. Rapid strep negative. Pt had hx of  similar symptoms 5 weeks ago and was treated successfully with prednisolone. Will give rx for same. Recommend follow up with PCP. Return precautions outlined in patient discharge instructions.    Final Clinical Impressions(s) / ED Diagnoses   Final diagnoses:  Uvulitis    New Prescriptions Discharge Medication List as of 06/22/2016 10:40 AM    START taking these medications   Details  prednisoLONE (PRELONE) 15 MG/5ML SOLN Take 3.3 mLs (9.9 mg total) by mouth daily before breakfast., Starting Tue 06/22/2016, Until Sun 06/27/2016, Print         [employer] InstrumentsSamantha Tripp Nolia Tschantz, PA-C 06/22/16 1508    Bethann BerkshireJoseph Zammit, MD 06/26/16 [phone]

## 2016-06-24 LAB — CULTURE, GROUP A STREP (THRC)

## 2016-07-01 ENCOUNTER — Encounter (HOSPITAL_COMMUNITY): Payer: Self-pay | Admitting: Emergency Medicine

## 2017-10-06 ENCOUNTER — Encounter (HOSPITAL_COMMUNITY): Payer: Self-pay | Admitting: Emergency Medicine

## 2018-01-31 ENCOUNTER — Encounter: Payer: Self-pay | Admitting: *Deleted

## 2018-01-31 ENCOUNTER — Ambulatory Visit: Payer: BLUE CROSS/BLUE SHIELD | Admitting: Emergency Medicine

## 2018-01-31 VITALS — BP 124/80 | HR 94 | Ht 66.0 in | Wt 167.0 lb

## 2018-01-31 DIAGNOSIS — J029 Acute pharyngitis, unspecified: Secondary | ICD-10-CM

## 2018-01-31 NOTE — Progress Notes (Signed)
Subjective:    Patient ID: [place] Pham, male    DOB: May 20, 1991, 27 y.o.   MRN: [id number]  HPI 27 year old man, former mild smoker, with a history of allergic rhinitis.  He presents today for evaluation of questionable asthma. He is interested in joining [employer], is going through the entry process. He denies any asthma flares. He does not wheeze. He has has Spring time allergies that can cause some cough. Good exertional tolerance.   Review of Systems  Constitutional: Negative for fever and unexpected weight change.  HENT: Negative for congestion, dental problem, ear pain, nosebleeds, postnasal drip, rhinorrhea, sinus pressure, sneezing, sore throat and trouble swallowing.   Eyes: Negative for redness and itching.  Respiratory: Negative for cough, chest tightness, shortness of breath and wheezing.   Cardiovascular: Negative for palpitations and leg swelling.  Gastrointestinal: Negative for nausea and vomiting.  Genitourinary: Negative for dysuria.  Musculoskeletal: Negative for joint swelling.  Skin: Negative for rash.  Neurological: Negative for headaches.  Hematological: Does not bruise/bleed easily.  Psychiatric/Behavioral: Negative for dysphoric mood. The patient is not nervous/anxious.    Past Medical History:  Diagnosis Date  . Allergic rhinitis      Family History  Problem Relation Age of Onset  . Hypertension Mother   . Hypertension Father   . Allergic rhinitis Father   . Asthma Father   . Sleep apnea Father      Social History   Socioeconomic History  . Marital status: Single    Spouse name: Not on file  . Number of children: Not on file  . Years of education: Not on file  . Highest education level: Not on file  Occupational History  . Not on file  Social Needs  . Financial resource strain: Not on file  . Food insecurity:    Worry: Not on file    Inability: Not on file  . Transportation needs:    Medical: Not on file    Non-medical: Not on  file  Tobacco Use  . Smoking status: Former Smoker    Packs/day: 0.25    Years: 4.00    Pack years: 1.00    Types: Cigarettes    Last attempt to quit: 08/1917    Years since quitting: 100.5  . Smokeless tobacco: Never Used  Substance and Sexual Activity  . Alcohol use: No  . Drug use: No  . Sexual activity: Not on file  Lifestyle  . Physical activity:    Days per week: Not on file    Minutes per session: Not on file  . Stress: Not on file  Relationships  . Social connections:    Talks on phone: Not on file    Gets together: Not on file    Attends religious service: Not on file    Active member of club or organization: Not on file    Attends meetings of clubs or organizations: Not on file    Relationship status: Not on file  . Intimate partner violence:    Fear of current or ex partner: Not on file    Emotionally abused: Not on file    Physically abused: Not on file    Forced sexual activity: Not on file  Other Topics Concern  . 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   No Known Allergies   Outpatient Medications  Prior to Visit  Medication Sig Dispense Refill  . EPINEPHrine (EPIPEN 2-PAK) 0.3 mg/0.3 mL IJ SOAJ injection Inject 0.3 mLs (0.3 mg total) into the muscle once as needed (for severe allergic reaction). CAll 911 immediately if you have to use this medicine 1 Device 1  . famotidine (PEPCID) 20 MG tablet Take 1 tablet (20 mg total) by mouth daily. (Patient not taking: Reported on 06/22/2016) 10 tablet 0  . hydrOXYzine (ATARAX) 10 MG/5ML syrup Take 10 mg by mouth 2 (two) times daily as needed (allergies).    . HydrOXYzine Pamoate (VISTARIL PO) Take by mouth.     No facility-administered medications prior to visit.          Objective:   Physical Exam Vitals:   01/31/18 1526  BP: 124/80  Pulse: 94  SpO2: 97%  Weight: 167 lb (75.8 kg)  Height: 5\' 6"  (1.676 m)    Gen: Pleasant, well-nourished, in no distress,  normal affect  ENT: No lesions,  mouth clear,  oropharynx clear, no postnasal drip  Neck: No JVD, no stridor  Lungs: No use of accessory muscles, clear without rales or rhonchi  Cardiovascular: RRR, heart sounds normal, no murmur or gallops, no peripheral edema  Musculoskeletal: No deformities, no cyanosis or clubbing  Neuro: alert, non focal  Skin: Warm, no lesions or rash      Assessment & Plan:  Pharyngitis He was given a dx of asthma when he was in the ED for eval of an acute pharyngitis 2017. He does not have any sx to suggest asthma, has never flared. I suspect that the dx was given in error, possibly based on some UA noise at the time of his illness. We will perform spiro here. 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 I will forward the data to him to take to his recruiter. If inadequate then we could always perform full PFT or a methacholine challenge to meet their requirements.    Addendum: Patient underwent spirometry that I have reviewed.  This was an acceptable test with an expiratory time greater than 7 seconds.  The trials were all reproducible and consistent.  His spirometry was normal with an FVC of 4.94 L (100% dated), FEV1 4.07 L (99% predicted) and an FEV1 to FVC ratio of 82%.. No evidence here to support asthma.  I will print this note for him to take to his recruiter as well as a copy of the spirometry  Levy Pupaobert Takina Busser, MD, PhD 01/31/2018, 4:12 PM [facility] [phone] or if no answer [phone]

## 2018-01-31 NOTE — Assessment & Plan Note (Addendum)
He was given a dx of asthma when he was in the ED for eval of an acute pharyngitis 2017. He does not have any sx to suggest asthma, has never flared. I suspect that the dx was given in error, possibly based on some UA noise at the time of his illness. We will perform spiro here. 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 I will forward the data to him to take to his recruiter. If inadequate then we could always perform full PFT or a methacholine challenge to meet their requirements.    Addendum: Patient underwent spirometry that I have reviewed.  This was an acceptable test with an expiratory time greater than 7 seconds.  The trials were all reproducible and consistent.  His spirometry was normal with an FVC of 4.94 L (100% dated), FEV1 4.07 L (99% predicted) and an FEV1 to FVC ratio of 82%.. No evidence here to support asthma.  I will print this note for him to take to his recruiter as well as a copy of the spirometry

## 2018-02-01 ENCOUNTER — Telehealth: Payer: Self-pay | Admitting: Emergency Medicine

## 2018-02-01 NOTE — Telephone Encounter (Signed)
ATC x1, VM was full. Will call back later.

## 2018-02-01 NOTE — Telephone Encounter (Signed)
Also, mentioned strep which is incorrect also.

## 2018-02-02 NOTE — Telephone Encounter (Signed)
Attempted to call patient, no answer, voicemailbox full, unable to leave message.

## 2018-02-03 ENCOUNTER — Encounter: Payer: Self-pay | Admitting: Emergency Medicine

## 2018-02-03 ENCOUNTER — Telehealth: Payer: Self-pay | Admitting: Emergency Medicine

## 2018-02-03 NOTE — Telephone Encounter (Signed)
Attempted to call pt so we could fix the information that needed to be corrected from his OV but unable to speak with pt and unable to leave VM due to box being full.  Since we have attempted to contact pt multiple times and have been unable to reach pt, per triage protocol, encounter will be closed.

## 2018-02-03 NOTE — Telephone Encounter (Signed)
Spoke with pt, he would like 3 things to be changed on his record:  1. Change the smoking quit date to 08/2017. I have done this already 2. Include he has not taken any form of medication in the last two years. 3. Include in the OV note that he does not have asthma.  RB please advise.    Assessment & Plan Note by Leslye PeerByrum, Robert S, MD at 01/31/2018 4:01 PM   Author: Leslye PeerByrum, Robert S, MD Author Type: Physician Filed: 01/31/2018 4:11 PM  Note Status: Carlisle CaterEdited Cosign: Cosign Not Required Encounter Date: 01/31/2018  Problem: Pharyngitis  Editor: Leslye PeerByrum, Robert S, MD (Physician)  Prior Versions: 1. Leslye PeerByrum, Robert S, MD (Physician) at 01/31/2018 4:04 PM - Written    He was given a dx of asthma when he was in the ED for eval of an acute pharyngitis 2017. He does not have any sx to suggest asthma, has never flared. I suspect that the dx was given in error, possibly based on some UA noise at the time of his illness. We will perform spiro here. 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 I will forward the data to him to take to his recruiter. If inadequate then we could always perform full PFT or a methacholine challenge to meet their requirements.    Addendum: Patient underwent spirometry that I have reviewed.  This was an acceptable test with an expiratory time greater than 7 seconds.  The trials were all reproducible and consistent.  His spirometry was normal with an FVC of 4.94 L (100% dated), FEV1 4.07 L (99% predicted) and an FEV1 to FVC ratio of 82%.. No evidence here to support asthma.  I will print this note for him to take to his recruiter as well as a copy of the spirometry

## 2018-02-06 NOTE — Telephone Encounter (Signed)
Spoke with pt. He is aware of Dr. Kavin LeechByrum's response. Pt needs documentation where his smoking history has changed. This will be mailed to him per his request. Nothing further was needed.

## 2018-02-06 NOTE — Telephone Encounter (Signed)
The note says that he doesn't have asthma right at the bottom of the A/p that you included in this message. That should suffice

## 2018-02-27 ENCOUNTER — Telehealth: Payer: Self-pay | Admitting: Emergency Medicine

## 2018-02-27 NOTE — Telephone Encounter (Signed)
Called and spoke to pt. 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 All things needed to be changed have been changed (see previous messages) aside from the "quit date" and "years since quit". This has been change to pt's quit date being 08/2017, my initials and my signature were next to the stricken line and next to the updated line. This has been placed up front for pick up. Pt verbalized understanding and denied any further questions or concerns at this time.

## 2018-10-04 ENCOUNTER — Other Ambulatory Visit: Payer: Self-pay | Admitting: Radiation Therapy

## 2018-10-09 ENCOUNTER — Other Ambulatory Visit: Payer: BLUE CROSS/BLUE SHIELD

## 2018-10-12 NOTE — Progress Notes (Signed)
Brain and Spine Tumor Board Documentation  [place] Pham was presented by Terry HeftyZachary Vaslow, [place] Pham at Brain and Spine Tumor Board on 10/12/2018, which included representatives from neuro oncology, radiation oncology, surgical oncology, radiology, pathology, navigation.  [place] Pham was presented as a new patient with history of the following treatments:  .  Additionally, we reviewed previous medical and familial history, history of present illness, and recent lab results along with all available histopathologic and imaging studies. The tumor board considered available treatment options and made the following recommendations:  Additional screening Rec repeating MRI to assess interval change  Tumor board is a meeting of clinicians from various specialty areas who evaluate and discuss patients for whom a multidisciplinary approach is being considered. Final determinations in the plan of care are those of the provider(s). The responsibility for follow up of recommendations given during tumor board is that of the provider.   Today's extended care, comprehensive team conference, [place] Pham was not present for the discussion and was not examined.

## 2020-06-24 ENCOUNTER — Ambulatory Visit: Payer: Self-pay

## 2022-03-14 ENCOUNTER — Encounter (HOSPITAL_BASED_OUTPATIENT_CLINIC_OR_DEPARTMENT_OTHER): Payer: Self-pay

## 2022-03-14 ENCOUNTER — Emergency Department (HOSPITAL_BASED_OUTPATIENT_CLINIC_OR_DEPARTMENT_OTHER): Payer: BLUE CROSS/BLUE SHIELD | Admitting: Radiology

## 2022-03-14 ENCOUNTER — Other Ambulatory Visit: Payer: Self-pay

## 2022-03-14 ENCOUNTER — Emergency Department (HOSPITAL_BASED_OUTPATIENT_CLINIC_OR_DEPARTMENT_OTHER)
Admission: EM | Admit: 2022-03-14 | Discharge: 2022-03-14 | Disposition: A | Discharge status: Home / Self Care | Payer: BLUE CROSS/BLUE SHIELD | Attending: Emergency Medicine | Admitting: Emergency Medicine

## 2022-03-14 DIAGNOSIS — M549 Dorsalgia, unspecified: Secondary | ICD-10-CM | POA: Insufficient documentation

## 2022-03-14 DIAGNOSIS — R002 Palpitations: Secondary | ICD-10-CM | POA: Insufficient documentation

## 2022-03-14 DIAGNOSIS — R0602 Shortness of breath: Secondary | ICD-10-CM | POA: Insufficient documentation

## 2022-03-14 DIAGNOSIS — R0789 Other chest pain: Secondary | ICD-10-CM | POA: Diagnosis not present

## 2022-03-14 DIAGNOSIS — R079 Chest pain, unspecified: Secondary | ICD-10-CM | POA: Diagnosis present

## 2022-03-14 LAB — HEPATIC FUNCTION PANEL
ALT: 9 U/L (ref 0–44)
AST: 10 U/L — ABNORMAL LOW (ref 15–41)
Albumin: 4.7 g/dL (ref 3.5–5.0)
Alkaline Phosphatase: 36 U/L — ABNORMAL LOW (ref 38–126)
Bilirubin, Direct: 0.1 mg/dL (ref 0.0–0.2)
Indirect Bilirubin: 0.6 mg/dL (ref 0.3–0.9)
Total Bilirubin: 0.7 mg/dL (ref 0.3–1.2)
Total Protein: 7 g/dL (ref 6.5–8.1)

## 2022-03-14 LAB — BASIC METABOLIC PANEL WITH GFR
Anion gap: 16 — ABNORMAL HIGH (ref 5–15)
BUN: 10 mg/dL (ref 6–20)
CO2: 19 mmol/L — ABNORMAL LOW (ref 22–32)
Calcium: 9.6 mg/dL (ref 8.9–10.3)
Chloride: 103 mmol/L (ref 98–111)
Creatinine, Ser: 0.78 mg/dL (ref 0.61–1.24)
GFR, Estimated: >60 mL/min
Glucose, Bld: 107 mg/dL — ABNORMAL HIGH (ref 70–99)
Potassium: 3.5 mmol/L (ref 3.5–5.1)
Sodium: 138 mmol/L (ref 135–145)

## 2022-03-14 LAB — CBC
HCT: 46 % (ref 39.0–52.0)
Hemoglobin: 16.2 g/dL (ref 13.0–17.0)
MCH: 30.4 pg (ref 26.0–34.0)
MCHC: 35.2 g/dL (ref 30.0–36.0)
MCV: 86.3 fL (ref 80.0–100.0)
Platelets: 253 10*3/uL (ref 150–400)
RBC: 5.33 MIL/uL (ref 4.22–5.81)
RDW: 13.1 % (ref 11.5–15.5)
WBC: 6.1 10*3/uL (ref 4.0–10.5)
nRBC: 0 % (ref 0.0–0.2)

## 2022-03-14 LAB — LIPASE, BLOOD: Lipase: 13 U/L (ref 11–51)

## 2022-03-14 LAB — TROPONIN I (HIGH SENSITIVITY)
Troponin I (High Sensitivity): 2 ng/L (ref <18)
Troponin I (High Sensitivity): 2 ng/L

## 2022-03-14 LAB — D-DIMER, QUANTITATIVE: D-Dimer, Quant: <0.27 ug{FEU}/mL (ref 0.00–0.50)

## 2022-03-14 NOTE — ED Provider Notes (Signed)
[facility] Provider Note   CSN: [id number] Arrival date & time: 03/14/22  0756     History  Chief Complaint  Patient presents with   Chest Pain    Terry Pham is a 31 y.o. male.  The history is provided by the patient and medical records. No language interpreter was used.  Chest Pain Pain location:  L chest, substernal area and L lateral chest Pain quality: aching   Pain radiates to:  Upper back Pain severity:  Moderate Onset quality:  Gradual Duration:  2 days Timing:  Intermittent Progression:  Waxing and waning Chronicity:  New Context: breathing   Relieved by:  Nothing Worsened by:  Deep breathing Ineffective treatments:  None tried Associated symptoms: back pain, palpitations and shortness of breath   Associated symptoms: no abdominal pain, no altered mental status, no anorexia, no cough, no diaphoresis, no fatigue, no fever, no headache, no lower extremity edema, no nausea, no numbness, no vomiting and no weakness   Risk factors: no coronary artery disease       Home Medications Prior to Admission medications   Not on File      Allergies    Patient has no known allergies.    Review of Systems   Review of Systems  Constitutional:  Negative for chills, diaphoresis, fatigue and fever.  HENT:  Negative for congestion.   Eyes:  Negative for visual disturbance.  Respiratory:  Positive for chest tightness and shortness of breath. Negative for cough, wheezing and stridor.   Cardiovascular:  Positive for chest pain and palpitations. Negative for leg swelling.  Gastrointestinal:  Negative for abdominal distention, abdominal pain, anorexia, constipation, diarrhea, nausea and vomiting.  Genitourinary:  Negative for dysuria and flank pain.  Musculoskeletal:  Positive for back pain. Negative for neck pain and neck stiffness.  Skin:  Negative for rash and wound.  Neurological:  Negative for weakness, light-headedness, numbness and  headaches.  Psychiatric/Behavioral:  Negative for agitation and confusion.   All other systems reviewed and are negative.  Physical Exam Updated Vital Signs BP (!) 118/93   Pulse 76   Temp 97.8 F (36.6 C) (Oral)   Resp 18   SpO2 100%  Physical Exam Vitals and nursing note reviewed.  Constitutional:      General: He is not in acute distress.    Appearance: He is well-developed. He is not ill-appearing, toxic-appearing or diaphoretic.  HENT:     Head: Normocephalic and atraumatic.  Eyes:     Conjunctiva/sclera: Conjunctivae normal.     Pupils: Pupils are equal, round, and reactive to light.  Cardiovascular:     Rate and Rhythm: Normal rate and regular rhythm.     Heart sounds: Normal heart sounds. No murmur heard. Pulmonary:     Effort: Pulmonary effort is normal. No respiratory distress.     Breath sounds: Normal breath sounds. No decreased breath sounds, wheezing, rhonchi or rales.  Chest:     Chest wall: No tenderness.  Abdominal:     Palpations: Abdomen is soft.     Tenderness: There is no abdominal tenderness.  Musculoskeletal:        General: No swelling.     Cervical back: Neck supple.     Right lower leg: No tenderness. No edema.     Left lower leg: No tenderness. No edema.  Skin:    General: Skin is warm and dry.     Capillary Refill: Capillary refill takes less than 2 seconds.  Findings: No erythema.  Neurological:     General: No focal deficit present.     Mental Status: He is alert.  Psychiatric:        Mood and Affect: Mood normal.    ED Results / Procedures / Treatments   Labs (all labs ordered are listed, but only abnormal results are displayed) Labs Reviewed  BASIC METABOLIC PANEL - Abnormal; Notable for the following components:      Result Value   CO2 19 (*)    Glucose, Bld 107 (*)    Anion gap 16 (*)    All other components within normal limits  CBC  HEPATIC FUNCTION PANEL  LIPASE, BLOOD  D-DIMER, QUANTITATIVE  TROPONIN I (HIGH  SENSITIVITY)    EKG EKG Interpretation  Date/Time:  Sunday Mar 14 2022 08:08:06 EDT Ventricular Rate:  78 PR Interval:  136 QRS Duration: 84 QT Interval:  364 QTC Calculation: 414 R Axis:   34 Text Interpretation: Normal sinus rhythm Normal ECG No previous ECGs available no prior ECG for comparison. No STEMI Confirmed by Antony Blackbird [phone]) on 03/14/2022 8:23:04 AM  Radiology DG Chest 2 View  Result Date: 03/14/2022 CLINICAL DATA:  31 year old male with chest pain for 2 days. Smoker. EXAM: CHEST - 2 VIEW COMPARISON:  None Available. FINDINGS: Normal lung volumes and mediastinal contours. Visualized tracheal air column is within normal limits. Both lungs appear clear. No pneumothorax or pleural effusion. Negative visible bowel gas. Negative visible osseous structures. IMPRESSION: Negative.  No cardiopulmonary abnormality. Electronically Signed   By: Genevie Ann M.D.   On: 03/14/2022 09:08    Procedures Procedures    Medications Ordered in ED Medications - No data to display  ED Course/ Medical Decision Making/ A&P                           Medical Decision Making Amount and/or Complexity of Data Reviewed Labs: ordered. Radiology: ordered.    Terry Pham is a 31 y.o. male with a past medical history significant for reported brain cancer who presents with left-sided chest discomfort rating around towards his back.  According to patient, since yesterday, he has had left-sided chest pain wrapping around his chest towards his back.  It comes and goes and is worse with deep breathing.  He reports associated shortness of breath that he could not take a deep breath and felt his heart was racing at times.  He reports no nausea, vomiting, or diaphoresis.  No syncope or lightheadedness.  He reports no trauma.  No rashes to suggest shingles.  No cough, congestion, or infectious symptoms reported.  No sick contacts.  He denies any leg pain, leg swelling, or history of DVT or PE.  Denies  any other complaints on arrival.  Reports the pain was 7 out of 10 when he was feeling it yesterday morning.  Reports the pain has improved drastically.  He does think there could be a component of anxiety with it.  Given his chest discomfort, he will be worked up to rule out life-threatening abnormality.  On exam, lungs were clear and chest was nontender.  I do not appreciate a murmur.  All extremities had intact pulses strength and sensation.  Patient did not have rash to suggest shingles.  I cannot reproduce the discomfort.  Midline back and neck were nontender.  Patient resting comfortably with minimal symptoms now.  Vital signs reassuring on arrival.  EKG did not show STEMI.  Given the patient's reported history of cancer, the reported fast heart rate, his pleuritic chest discomfort, and the pain wrapping around towards his back, I do feel need to get a D-dimer to rule out a pulmonary embolism.  We will get chest x-ray, labs including delta troponin, and due to the pain with deep breathing we will also get a lipase and hepatic function make sure there is not a subdiaphragmatic abnormality causing his significant pain.  Initial troponin was negative, will trend.  Chest x-ray was reassuring.  Patient is in agreement to get the rest of the labs and if reassuring, anticipate discharge home to follow-up with outpatient PCP team.  Work-up returned overall reassuring.  Delta troponin is negative.  D-dimer negative.  Chest x-ray reassuring.  Rest of work-up overall reassuring.  He is feeling better and would like to go home to get to his work for a [employer]/product [employer].  He asked if it could be anxiety or stress and we discussed that it could be but we do feel its reasonable to follow-up with the PCP.  He agrees with plan of care and had no other questions or concerns and was discharged in good condition with resolution of symptoms.         Final Clinical Impression(s) / ED Diagnoses Final diagnoses:   Atypical chest pain    Rx / DC Orders ED Discharge Orders     None       Clinical Impression: 1. Atypical chest pain     Disposition: Discharge  Condition: Good  I have discussed the results, Dx and Tx plan with the pt(& family if present). He/she/they expressed understanding and agree(s) with the plan. Discharge instructions discussed at great length. Strict return precautions discussed and pt &/or family have verbalized understanding of the instructions. No further questions at time of discharge.    New Prescriptions   No medications on file    Follow Up: [facility], [place] [id number] [facility] [place]  [place] [phone]        Raniya Golembeski, Gwenyth Allegra, MD 03/14/22 [phone]

## 2022-03-14 NOTE — Discharge Instructions (Signed)
Your history, exam, and work-up today were overall reassuring.  No concerning findings discovered on imaging and labs.  Your blood clot test was negative, chest x-ray was reassuring, and heart enzymes were negative both times we checked them.  You demonstrated stability for over 3-1/2 hours without any abnormalities on the monitor and we feel you are safe for discharge home.  Please rest, stay hydrated, and try to decrease anxiety and stress levels.  Please follow-up with a primary doctor.  If any symptoms change or worsen acutely, please return to the nearest emergency department.

## 2022-03-14 NOTE — ED Triage Notes (Signed)
He c/o non-traumatic upper chest pain radiating into neck/jaw since yesterday. His skin is normal, warm and dry and he is breathing normally. He is in no distress.

## 2022-03-14 NOTE — ED Notes (Signed)
Discharge paperwork given and understood. 

## 2022-10-30 ENCOUNTER — Encounter (HOSPITAL_BASED_OUTPATIENT_CLINIC_OR_DEPARTMENT_OTHER): Payer: Self-pay

## 2022-10-30 ENCOUNTER — Emergency Department (HOSPITAL_BASED_OUTPATIENT_CLINIC_OR_DEPARTMENT_OTHER): Payer: Medicaid Other

## 2022-10-30 ENCOUNTER — Emergency Department (HOSPITAL_BASED_OUTPATIENT_CLINIC_OR_DEPARTMENT_OTHER)
Admission: EM | Admit: 2022-10-30 | Discharge: 2022-10-30 | Disposition: A | Discharge status: Home / Self Care | Payer: Medicaid Other | Attending: Emergency Medicine | Admitting: Emergency Medicine

## 2022-10-30 DIAGNOSIS — R062 Wheezing: Secondary | ICD-10-CM | POA: Insufficient documentation

## 2022-10-30 DIAGNOSIS — Z1152 Encounter for screening for COVID-19: Secondary | ICD-10-CM | POA: Insufficient documentation

## 2022-10-30 DIAGNOSIS — J45909 Unspecified asthma, uncomplicated: Secondary | ICD-10-CM | POA: Diagnosis not present

## 2022-10-30 DIAGNOSIS — H6123 Impacted cerumen, bilateral: Secondary | ICD-10-CM | POA: Insufficient documentation

## 2022-10-30 DIAGNOSIS — J111 Influenza due to unidentified influenza virus with other respiratory manifestations: Secondary | ICD-10-CM

## 2022-10-30 DIAGNOSIS — E860 Dehydration: Secondary | ICD-10-CM | POA: Diagnosis not present

## 2022-10-30 DIAGNOSIS — H6693 Otitis media, unspecified, bilateral: Secondary | ICD-10-CM | POA: Diagnosis not present

## 2022-10-30 DIAGNOSIS — J101 Influenza due to other identified influenza virus with other respiratory manifestations: Secondary | ICD-10-CM | POA: Insufficient documentation

## 2022-10-30 LAB — CBC WITH DIFFERENTIAL/PLATELET
Abs Immature Granulocytes: 0.02 10*3/uL (ref 0.00–0.07)
Basophils Absolute: 0 10*3/uL (ref 0.0–0.1)
Basophils Relative: 0 %
Eosinophils Absolute: 0 10*3/uL (ref 0.0–0.5)
Eosinophils Relative: 1 %
HCT: 38.8 % — ABNORMAL LOW (ref 39.0–52.0)
Hemoglobin: 13.3 g/dL (ref 13.0–17.0)
Immature Granulocytes: 0 %
Lymphocytes Relative: 12 %
Lymphs Abs: 0.8 10*3/uL (ref 0.7–4.0)
MCH: 29.9 pg (ref 26.0–34.0)
MCHC: 34.3 g/dL (ref 30.0–36.0)
MCV: 87.2 fL (ref 80.0–100.0)
Monocytes Absolute: 0.5 10*3/uL (ref 0.1–1.0)
Monocytes Relative: 8 %
Neutro Abs: 5 10*3/uL (ref 1.7–7.7)
Neutrophils Relative %: 79 %
Platelets: 207 10*3/uL (ref 150–400)
RBC: 4.45 MIL/uL (ref 4.22–5.81)
RDW: 12.2 % (ref 11.5–15.5)
WBC: 6.3 10*3/uL (ref 4.0–10.5)
nRBC: 0 % (ref 0.0–0.2)

## 2022-10-30 LAB — RESP PANEL BY RT-PCR (RSV, FLU A&B, COVID)  RVPGX2
Influenza A by PCR: POSITIVE — AB
Influenza B by PCR: NEGATIVE
Resp Syncytial Virus by PCR: NEGATIVE
SARS Coronavirus 2 by RT PCR: NEGATIVE

## 2022-10-30 LAB — COMPREHENSIVE METABOLIC PANEL
ALT: 13 U/L (ref 0–44)
AST: 12 U/L — ABNORMAL LOW (ref 15–41)
Albumin: 4.5 g/dL (ref 3.5–5.0)
Alkaline Phosphatase: 41 U/L (ref 38–126)
Anion gap: 10 (ref 5–15)
BUN: 16 mg/dL (ref 6–20)
CO2: 23 mmol/L (ref 22–32)
Calcium: 9.1 mg/dL (ref 8.9–10.3)
Chloride: 100 mmol/L (ref 98–111)
Creatinine, Ser: 0.93 mg/dL (ref 0.61–1.24)
GFR, Estimated: >60 mL/min (ref >60)
Glucose, Bld: 105 mg/dL — ABNORMAL HIGH (ref 70–99)
Potassium: 3.9 mmol/L (ref 3.5–5.1)
Sodium: 133 mmol/L — ABNORMAL LOW (ref 135–145)
Total Bilirubin: 0.3 mg/dL (ref 0.3–1.2)
Total Protein: 6.8 g/dL (ref 6.5–8.1)

## 2022-10-30 LAB — TROPONIN I (HIGH SENSITIVITY): Troponin I (High Sensitivity): <2 ng/L (ref <18)

## 2022-10-30 LAB — D-DIMER, QUANTITATIVE: D-Dimer, Quant: 0.35 ug/mL-FEU (ref 0.00–0.50)

## 2022-10-30 MED ORDER — LACTATED RINGERS IV BOLUS
1000.0000 mL | Freq: Once | INTRAVENOUS | Status: AC
  Administered 2022-10-30: 1000 mL via INTRAVENOUS

## 2022-10-30 MED ORDER — BENZONATATE 100 MG PO CAPS: 100.0000 mg | ORAL_CAPSULE | Freq: Three times a day (TID) | ORAL | 0 refills | Status: AC

## 2022-10-30 MED ORDER — PREDNISONE 10 MG PO TABS: 40.0000 mg | ORAL_TABLET | Freq: Every day | ORAL | 0 refills | Status: AC

## 2022-10-30 MED ORDER — IBUPROFEN 800 MG PO TABS
800.0000 mg | ORAL_TABLET | Freq: Once | ORAL | Status: AC
  Administered 2022-10-30: 800 mg via ORAL
  Filled 2022-10-30: qty 1

## 2022-10-30 MED ORDER — AEROCHAMBER PLUS FLO-VU MEDIUM MISC
1.0000 | Freq: Once | Status: AC
  Administered 2022-10-30: 1
  Filled 2022-10-30: qty 1

## 2022-10-30 MED ORDER — OSELTAMIVIR PHOSPHATE 75 MG PO CAPS: 75.0000 mg | ORAL_CAPSULE | Freq: Two times a day (BID) | ORAL | 0 refills | Status: AC

## 2022-10-30 MED ORDER — ALBUTEROL SULFATE HFA 108 (90 BASE) MCG/ACT IN AERS
2.0000 | INHALATION_SPRAY | Freq: Once | RESPIRATORY_TRACT | Status: AC
  Administered 2022-10-30: 2 via RESPIRATORY_TRACT
  Filled 2022-10-30: qty 6.7

## 2022-10-30 NOTE — ED Triage Notes (Addendum)
He c/o waxing/waning fever, plus uri sx since yesterday. He is in no distress. He tells me he took Tylenol 1 hour p.t.a.

## 2022-10-30 NOTE — ED Notes (Signed)
Both ears flushed with elephant ear wash, left ear with minimal wax, right ear with moderate amount of wax removed.

## 2022-10-30 NOTE — ED Provider Notes (Addendum)
[facility] Provider Note   CSN: [id number] Arrival date & time: 10/30/22  0272     History  Chief Complaint  Patient presents with   URI    Terry Pham is a 32 y.o. male.   URI    32 year old male with medical history significant for childhood asthma who presents to the emergency department with waxing and waning fever with URI symptoms for the last 3 days.  He states that he has had cough, muscle aches, fever to 102.5 at home.  He took an extra strength Tylenol 1 hour prior to arrival.   He brings up some mucus with his cough.  He does not currently take any medications for asthma.  He is tolerating oral intake.  He initially denied any chest pain or shortness of breath and then endorsed some sharp pleuritic chest pain on the right, while taking a deep breath. He endorses a continued cough productive of sputum.   Home Medications Prior to Admission medications   Medication Sig Start Date End Date Taking? Authorizing Provider  benzonatate (TESSALON) 100 MG capsule Take 1 capsule (100 mg total) by mouth every 8 (eight) hours. 10/30/22  Yes Regan Lemming, MD  oseltamivir (TAMIFLU) 75 MG capsule Take 1 capsule (75 mg total) by mouth every 12 (twelve) hours. 10/30/22  Yes Regan Lemming, MD  predniSONE (DELTASONE) 10 MG tablet Take 4 tablets (40 mg total) by mouth daily for 5 days. 10/30/22 11/04/22 Yes Regan Lemming, MD      Allergies    Patient has no known allergies.    Review of Systems   Review of Systems  All other systems reviewed and are negative.   Physical Exam Updated Vital Signs BP (!) 101/59   Pulse (!) 121   Temp (!) 100.6 F (38.1 C) (Oral)   Resp (!) 30   SpO2 95%  Physical Exam Vitals and nursing note reviewed.  Constitutional:      General: He is not in acute distress.    Appearance: He is well-developed.  HENT:     Head: Normocephalic and atraumatic.     Right Ear: There is impacted cerumen.     Left Ear: There is  impacted cerumen.  Eyes:     Conjunctiva/sclera: Conjunctivae normal.  Cardiovascular:     Rate and Rhythm: Regular rhythm. Tachycardia present.     Heart sounds: No murmur heard. Pulmonary:     Effort: Pulmonary effort is normal. No respiratory distress.     Breath sounds: No stridor. Wheezing present. No rhonchi or rales.     Comments: Faint expiratory wheezing on the right Abdominal:     Palpations: Abdomen is soft.     Tenderness: There is no abdominal tenderness.  Musculoskeletal:        General: No swelling.     Cervical back: Neck supple.  Skin:    General: Skin is warm and dry.     Capillary Refill: Capillary refill takes less than 2 seconds.  Neurological:     Mental Status: He is alert.  Psychiatric:        Mood and Affect: Mood normal.     ED Results / Procedures / Treatments   Labs (all labs ordered are listed, but only abnormal results are displayed) Labs Reviewed  RESP PANEL BY RT-PCR (RSV, FLU A&B, COVID)  RVPGX2 - Abnormal; Notable for the following components:      Result Value   Influenza A by PCR POSITIVE (*)  All other components within normal limits  CBC WITH DIFFERENTIAL/PLATELET - Abnormal; Notable for the following components:   HCT 38.8 (*)    All other components within normal limits  COMPREHENSIVE METABOLIC PANEL - Abnormal; Notable for the following components:   Sodium 133 (*)    Glucose, Bld 105 (*)    AST 12 (*)    All other components within normal limits  D-DIMER, QUANTITATIVE  TROPONIN I (HIGH SENSITIVITY)    EKG EKG Interpretation  Date/Time:  Saturday October 30 2022 08:43:30 EST Ventricular Rate:  124 PR Interval:  132 QRS Duration: 85 QT Interval:  295 QTC Calculation: 424 R Axis:   22 Text Interpretation: Sinus tachycardia Confirmed by Ernie Avena ([id number]) on 10/30/2022 8:57:32 AM  Radiology DG Chest Port 1 View  Result Date: 10/30/2022 CLINICAL DATA:  Chest pain.  Cough. EXAM: PORTABLE CHEST 1 VIEW COMPARISON:  Mar 14, 2022 FINDINGS: The heart size and mediastinal contours are within normal limits. Both lungs are clear. The visualized skeletal structures are unremarkable. IMPRESSION: No active disease. Electronically Signed   By: Gerome Sam III M.D.   On: 10/30/2022 09:20    Procedures .Ear Cerumen Removal  Date/Time: 10/30/2022 9:44 AM  Performed by: Tammy Sours, RN Authorized by: Ernie Avena, MD   Consent:    Consent obtained:  Verbal   Consent given by:  Patient Universal protocol:    Patient identity confirmed:  Verbally with patient Procedure details:    Location:  L ear and R ear   Procedure type: irrigation     Procedure outcomes: cerumen removed   Post-procedure details:    Hearing quality:  Normal   Procedure completion:  Tolerated     Medications Ordered in ED Medications  ibuprofen (ADVIL) tablet 800 mg (800 mg Oral Given 10/30/22 0759)  albuterol (VENTOLIN HFA) 108 (90 Base) MCG/ACT inhaler 2 puff (2 puffs Inhalation Given 10/30/22 0833)  AeroChamber Plus Flo-Vu Medium MISC 1 each (1 each Other Given 10/30/22 0833)  lactated ringers bolus 1,000 mL (1,000 mLs Intravenous New Bag/Given 10/30/22 0846)    ED Course/ Medical Decision Making/ A&P Clinical Course as of 10/30/22 0944  Sat Oct 30, 2022  0819 Influenza A By PCR(!): POSITIVE [JL]    Clinical Course User Index [JL] Ernie Avena, MD                           Medical Decision Making Amount and/or Complexity of Data Reviewed Labs: ordered. Decision-making details documented in ED Course. Radiology: ordered.  Risk Prescription drug management.     32 year old male with medical history significant for childhood asthma who presents to the emergency department with waxing and waning fever with URI symptoms for the last 3 days.  He states that he has had cough, muscle aches, fever to 102.5 at home.  He took an extra strength Tylenol 1 hour prior to arrival.   He brings up some mucus with his cough.  He does not  currently take any medications for asthma.  He is tolerating oral intake.  He initially denied any chest pain or shortness of breath and then endorsed some sharp pleuritic chest pain on the right, while taking a deep breath. He endorses a continued cough productive of sputum.   On my exam, the patient is well-appearing and well-hydrated.  He has had some decreased p.o. intake and appears mildly dehydrated with sinus tachycardia noted.  The patient's lungs are clear to  auscultation bilaterally. Additionally, the patient has a soft/non-tender abdomen, and no oropharyngeal exudates.  There are no signs of meningismus.  I see no signs of an acute bacterial infection with the exception being that the patient had bilateral cerumen impaction so initial visualization of the TMs were not successful.  Patient's ears were subsequently irrigated by nursing staff and his TMs did not appear infected.   The patient's presentation is most consistent with a viral upper respiratory infection.  I have a low suspicion for pneumonia as the patient's cough has been non-productive and the patient is neither tachypneic nor hypoxic on room air.  Additionally, the patient is CTAB with mild faint expiratory wheezing noted on the right.  An EKG was performed given the patient's chest discomfort and sinus tachycardia which resulted negative for acute ischemic changes, tachycardia noted, heart rate 124.  Chest x-ray revealed no focal consolidation to suggest bacterial pneumonia.  A CBC was without a leukocytosis or anemia, CMP revealed mild hyponatremia to 133.  A D-dimer was negative and initial troponin was less than 2.  Low concern for ACS.  No evidence for myocarditis, no pericarditis changes seen on EKG.  Patient was administered an IV fluid bolus given his tachycardia and decreased p.o. intake.  Influenza/Influenza-Like Illness is possible, especially considering the current prevalence of disease. COVID-19, influenza, RSV PCR  testing was collected and the patient was positive for influenza A.  I discussed the risks and benefits of antiviral therapy. They do want to pursue antiviral therapy.  I dressed the side effects and possible benefits of antiviral therapy.   He was administered 800 mg of ibuprofen for fever control.  He was advised and counseled on proper dosing for both ibuprofen and Tylenol.  He was advised continued oral rehydration.  Given his faint wheezing, he was administered albuterol MDI.  Will place the patient on a course of steroids in addition to utilizing of an MDI inhaler at home.  He is overall well-appearing, tolerating oral intake.  Tachycardic but febrile in the setting of known influenza infection, tolerating oral intake, overall well-appearing, no signs of serious bacterial infection.  Patient symptomatically improved with improved heart rate following an IV fluid bolus.  Suggest there is a component of dehydration in addition to his influenza infection.  Feeling symptomatically improved on repeat assessment.  I discussed symptomatic management, including hydration, motrin, and tylenol. The patient/family felt safe being discharged from the ED.  They agreed to followup with the PCP if needed.  I provided ED return precautions.   Final Clinical Impression(s) / ED Diagnoses Final diagnoses:  Influenza  Wheezing  Dehydration  Bilateral impacted cerumen    Rx / DC Orders ED Discharge Orders          Ordered    predniSONE (DELTASONE) 10 MG tablet  Daily        10/30/22 0826    oseltamivir (TAMIFLU) 75 MG capsule  Every 12 hours        10/30/22 0826    benzonatate (TESSALON) 100 MG capsule  Every 8 hours        10/30/22 0826              Ernie Avena, MD 10/30/22 0830    Ernie Avena, MD 10/30/22 [phone]

## 2022-10-30 NOTE — Discharge Instructions (Addendum)
Take 1000 mg of Tylenol every 6 hours, alternate with 600 mg of ibuprofen for fever control and pain control.  Ensure you are drinking plenty of fluids to stay hydrated, drink electrolyte containing solutions like Pedialyte or Gatorade in addition to water.  Return for concern for dehydration, worsening of symptoms for any reason.  You did have mild wheezing on exam and given your history of childhood asthma will treat with albuterol, steroids.

## 2022-10-30 NOTE — ED Notes (Signed)
Dc instructions reviewed with patient. Patient voiced understanding. Dc with belongings.  °

## 2022-12-26 DIAGNOSIS — Z23 Encounter for immunization: Secondary | ICD-10-CM | POA: Diagnosis not present

## 2022-12-26 DIAGNOSIS — S60411A Abrasion of left index finger, initial encounter: Secondary | ICD-10-CM | POA: Diagnosis not present

## 2022-12-26 DIAGNOSIS — W268XXA Contact with other sharp object(s), not elsewhere classified, initial encounter: Secondary | ICD-10-CM | POA: Diagnosis not present

## 2023-02-12 DIAGNOSIS — M549 Dorsalgia, unspecified: Secondary | ICD-10-CM | POA: Diagnosis not present

## 2023-05-04 IMAGING — DX DG CHEST 2V
2 series · 2 of 2 positions shown · non-contrast
Comparison: None Available.

CLINICAL DATA: 30-year-old male with chest pain for 2 days. Smoker.

EXAM:
CHEST - 2 VIEW

[chest pa]
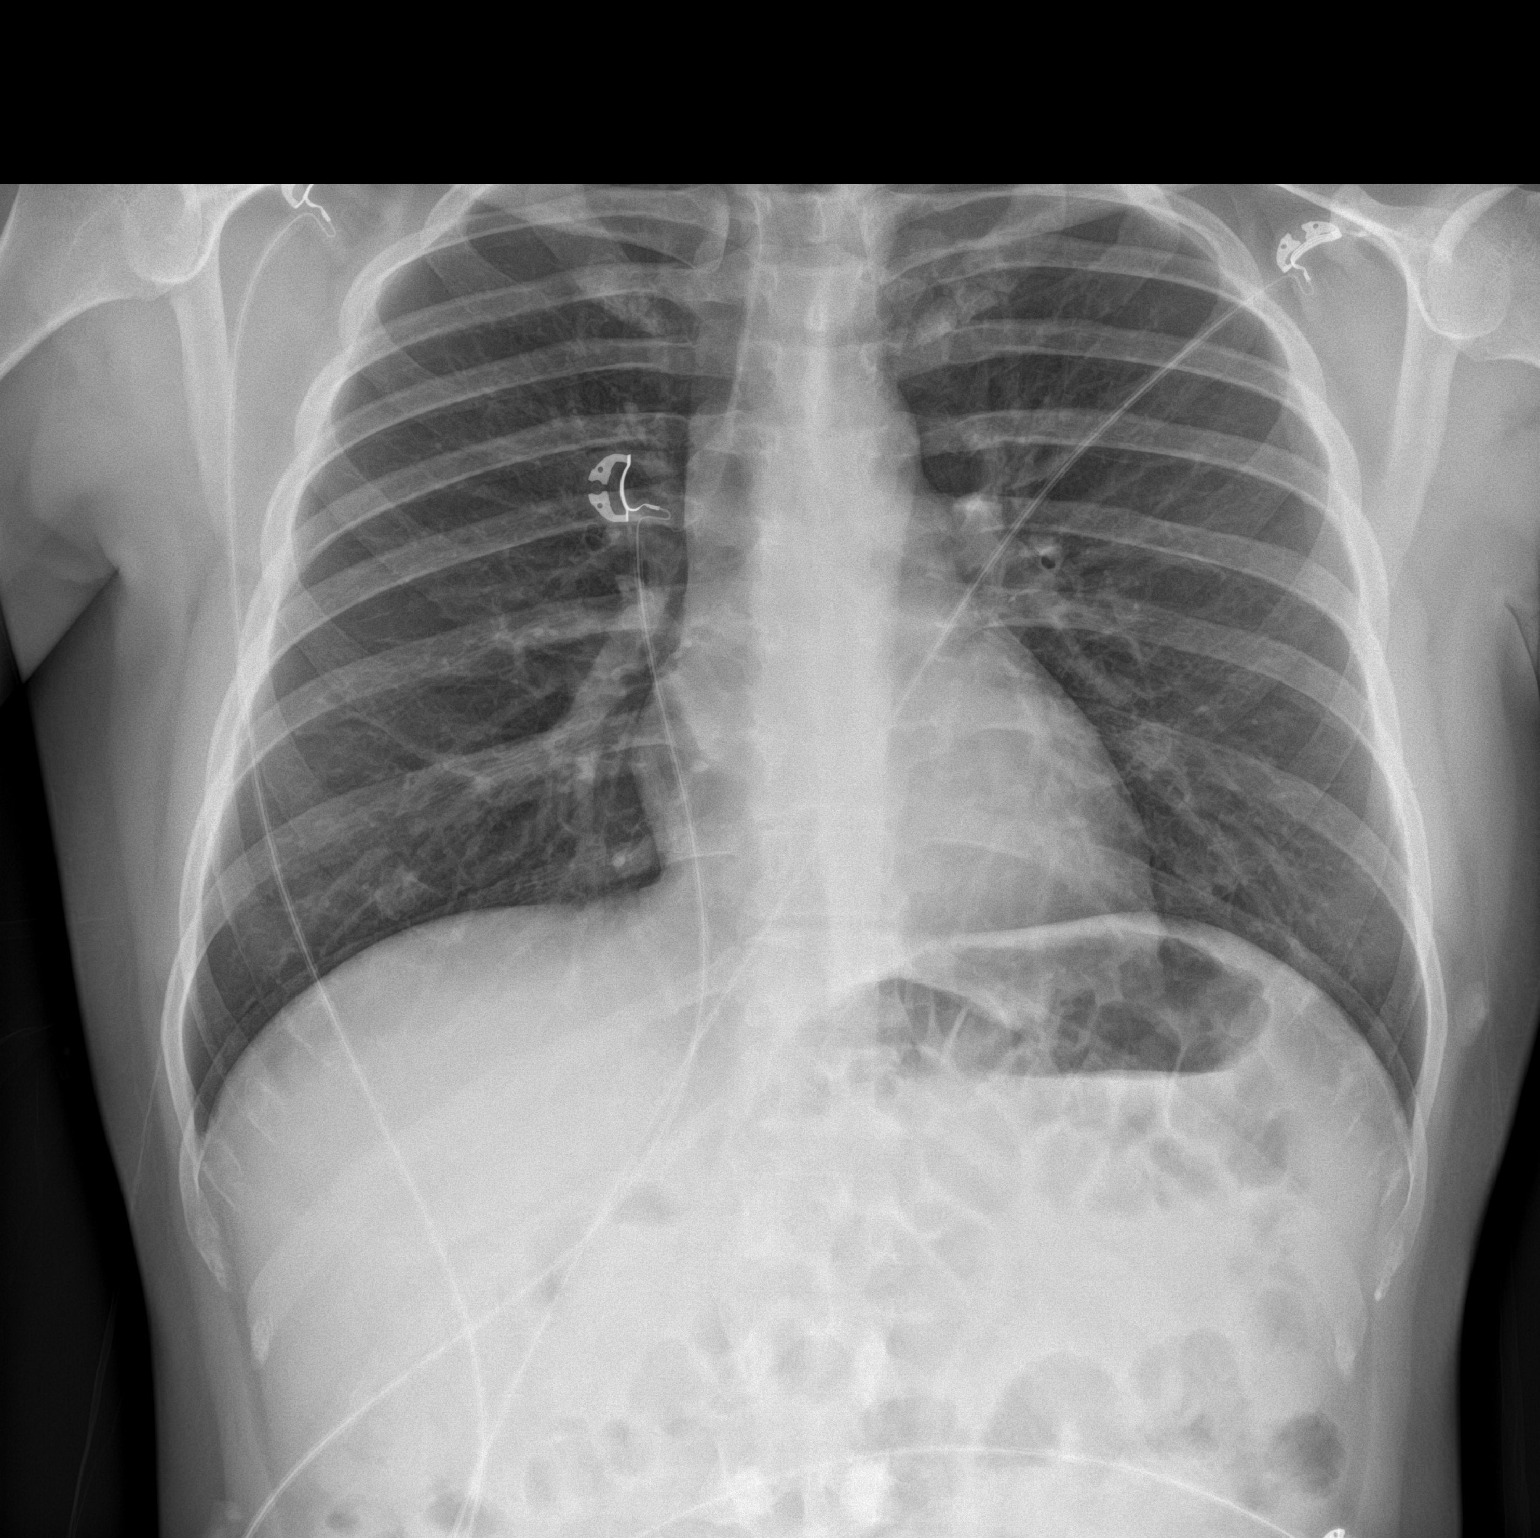

[chest lat]
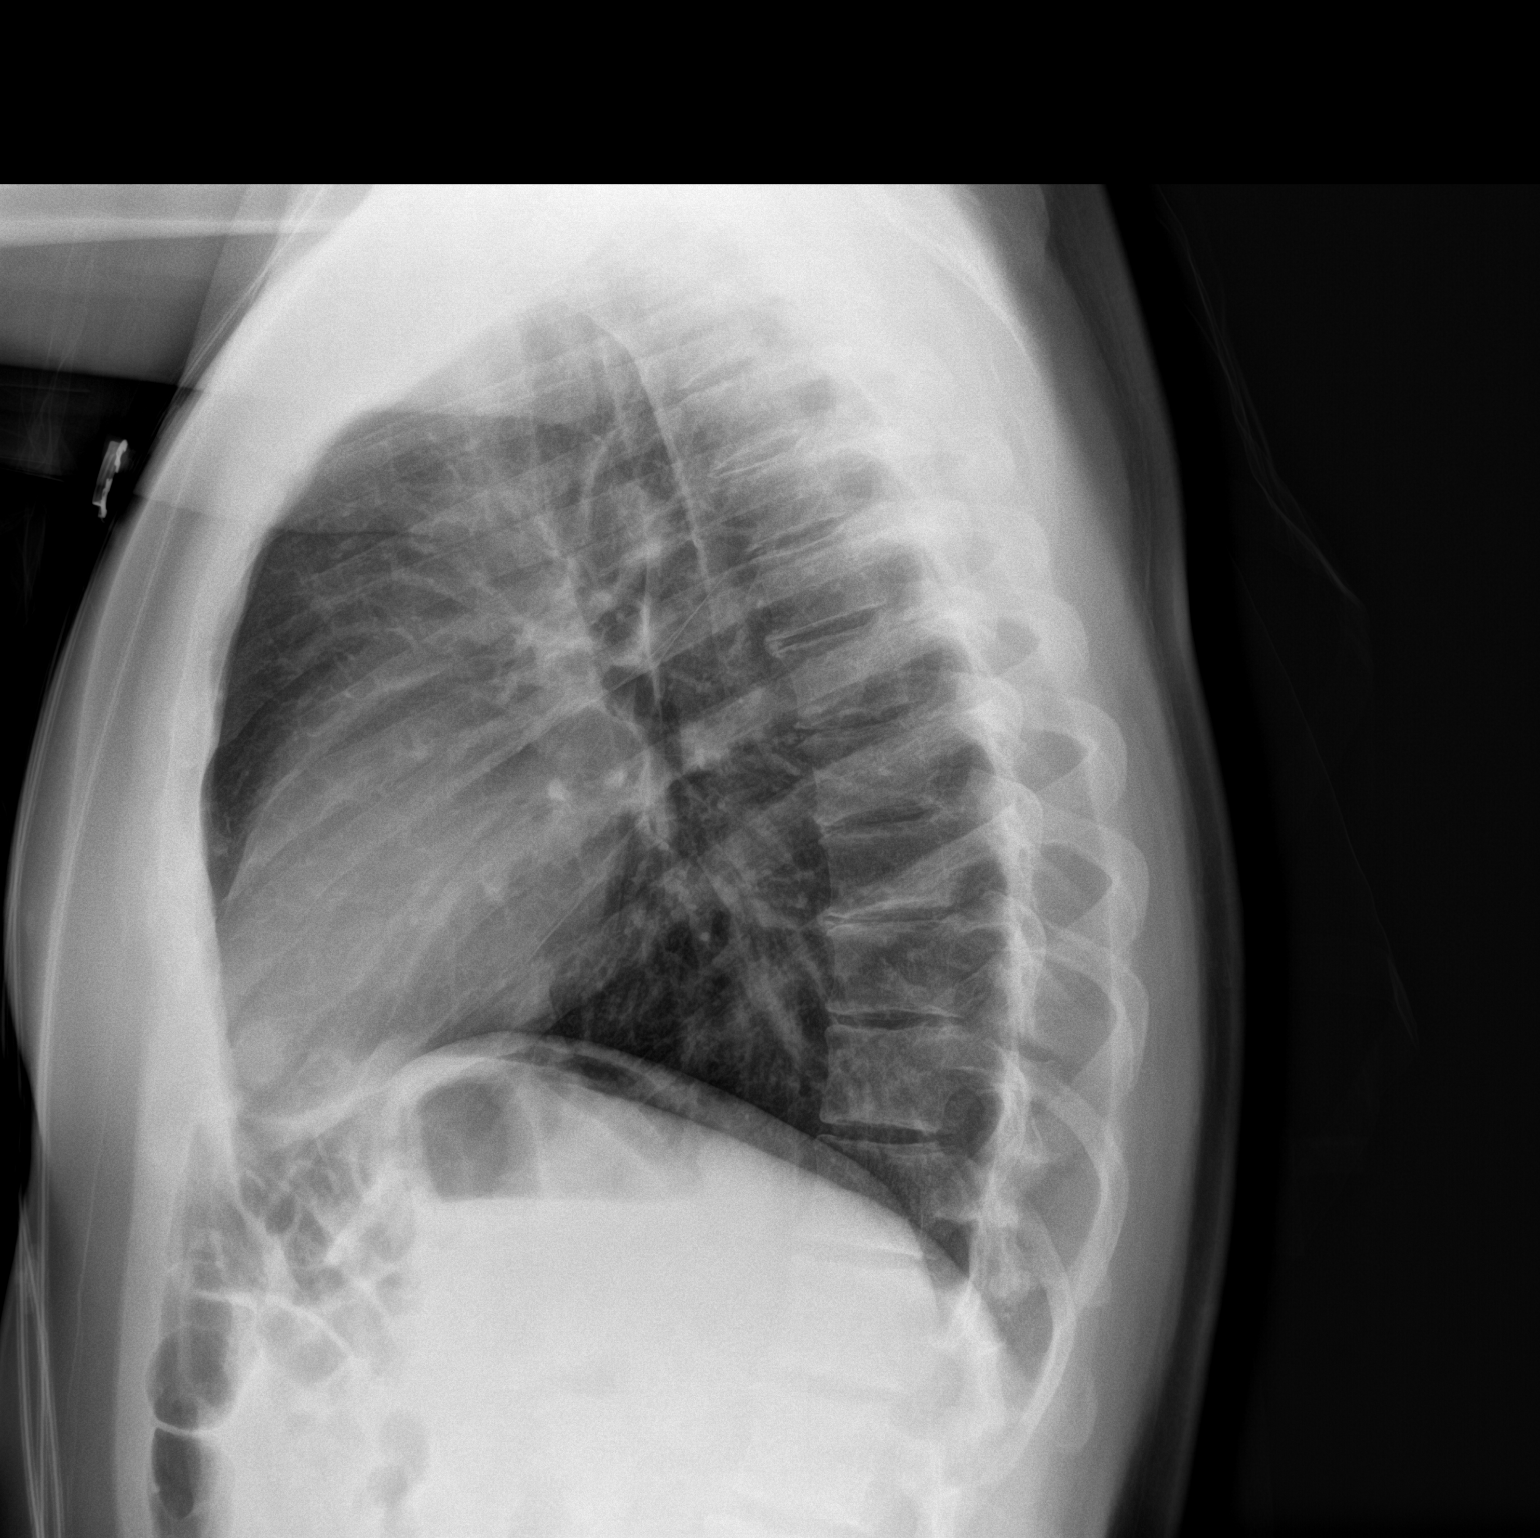

[2 of 2 positions shown; findings below may reference images not displayed]

FINDINGS: Normal lung volumes and mediastinal contours. Visualized tracheal
air column is within normal limits. Both lungs appear clear. No
pneumothorax or pleural effusion. Negative visible bowel gas.
Negative visible osseous structures.
IMPRESSION: Negative.  No cardiopulmonary abnormality.

## 2023-12-27 DIAGNOSIS — L0231 Cutaneous abscess of buttock: Secondary | ICD-10-CM | POA: Diagnosis not present

## 2024-03-29 DIAGNOSIS — G939 Disorder of brain, unspecified: Secondary | ICD-10-CM | POA: Diagnosis not present

## 2024-03-29 DIAGNOSIS — D496 Neoplasm of unspecified behavior of brain: Secondary | ICD-10-CM | POA: Diagnosis not present

## 2024-05-04 DIAGNOSIS — J209 Acute bronchitis, unspecified: Secondary | ICD-10-CM | POA: Diagnosis not present

## 2024-05-04 DIAGNOSIS — R059 Cough, unspecified: Secondary | ICD-10-CM | POA: Diagnosis not present

## 2024-05-04 DIAGNOSIS — R0989 Other specified symptoms and signs involving the circulatory and respiratory systems: Secondary | ICD-10-CM | POA: Diagnosis not present

## 2024-06-26 DIAGNOSIS — R030 Elevated blood-pressure reading, without diagnosis of hypertension: Secondary | ICD-10-CM | POA: Diagnosis not present

## 2024-06-26 DIAGNOSIS — Z0001 Encounter for general adult medical examination with abnormal findings: Secondary | ICD-10-CM | POA: Diagnosis not present

## 2024-07-11 DIAGNOSIS — D496 Neoplasm of unspecified behavior of brain: Secondary | ICD-10-CM | POA: Diagnosis not present
